# Patient Record
Sex: Male | Born: 1960 | Race: White | Hispanic: No | Smoking: Current every day smoker
Health system: Southern US, Community
[De-identification: ages and names within clinical notes are randomized; demographics above are authoritative.]

## PROBLEM LIST (undated history)

## (undated) DIAGNOSIS — F419 Anxiety disorder, unspecified: Secondary | ICD-10-CM

## (undated) DIAGNOSIS — J449 Chronic obstructive pulmonary disease, unspecified: Secondary | ICD-10-CM

## (undated) DIAGNOSIS — K219 Gastro-esophageal reflux disease without esophagitis: Secondary | ICD-10-CM

## (undated) DIAGNOSIS — M545 Low back pain: Secondary | ICD-10-CM

## (undated) DIAGNOSIS — I10 Essential (primary) hypertension: Secondary | ICD-10-CM

## (undated) DIAGNOSIS — E669 Obesity, unspecified: Secondary | ICD-10-CM

## (undated) DIAGNOSIS — G8929 Other chronic pain: Secondary | ICD-10-CM

## (undated) DIAGNOSIS — G4730 Sleep apnea, unspecified: Secondary | ICD-10-CM

## (undated) DIAGNOSIS — F259 Schizoaffective disorder, unspecified: Secondary | ICD-10-CM

## (undated) HISTORY — PX: HERNIA REPAIR: SHX51

## (undated) HISTORY — DX: Low back pain: M54.5

## (undated) HISTORY — DX: Chronic obstructive pulmonary disease, unspecified: J44.9

## (undated) HISTORY — DX: Gastro-esophageal reflux disease without esophagitis: K21.9

## (undated) HISTORY — DX: Anxiety disorder, unspecified: F41.9

## (undated) HISTORY — DX: Other chronic pain: G89.29

## (undated) HISTORY — DX: Obesity, unspecified: E66.9

## (undated) HISTORY — DX: Sleep apnea, unspecified: G47.30

## (undated) HISTORY — DX: Schizoaffective disorder, unspecified: F25.9

## (undated) HISTORY — DX: Essential (primary) hypertension: I10

---

## 2009-06-12 ENCOUNTER — Inpatient Hospital Stay: Payer: Self-pay | Admitting: Internal Medicine

## 2009-10-22 ENCOUNTER — Inpatient Hospital Stay: Payer: Self-pay | Admitting: Internal Medicine

## 2010-05-21 ENCOUNTER — Inpatient Hospital Stay: Payer: Self-pay | Admitting: Internal Medicine

## 2010-08-02 ENCOUNTER — Inpatient Hospital Stay: Payer: Self-pay | Admitting: Internal Medicine

## 2010-11-25 IMAGING — CR DG CHEST 1V PORT
1 series · 1 of 1 positions shown · non-contrast
Comparison: none

REASON FOR EXAM: resp failure
COMMENTS:

[view not recorded]
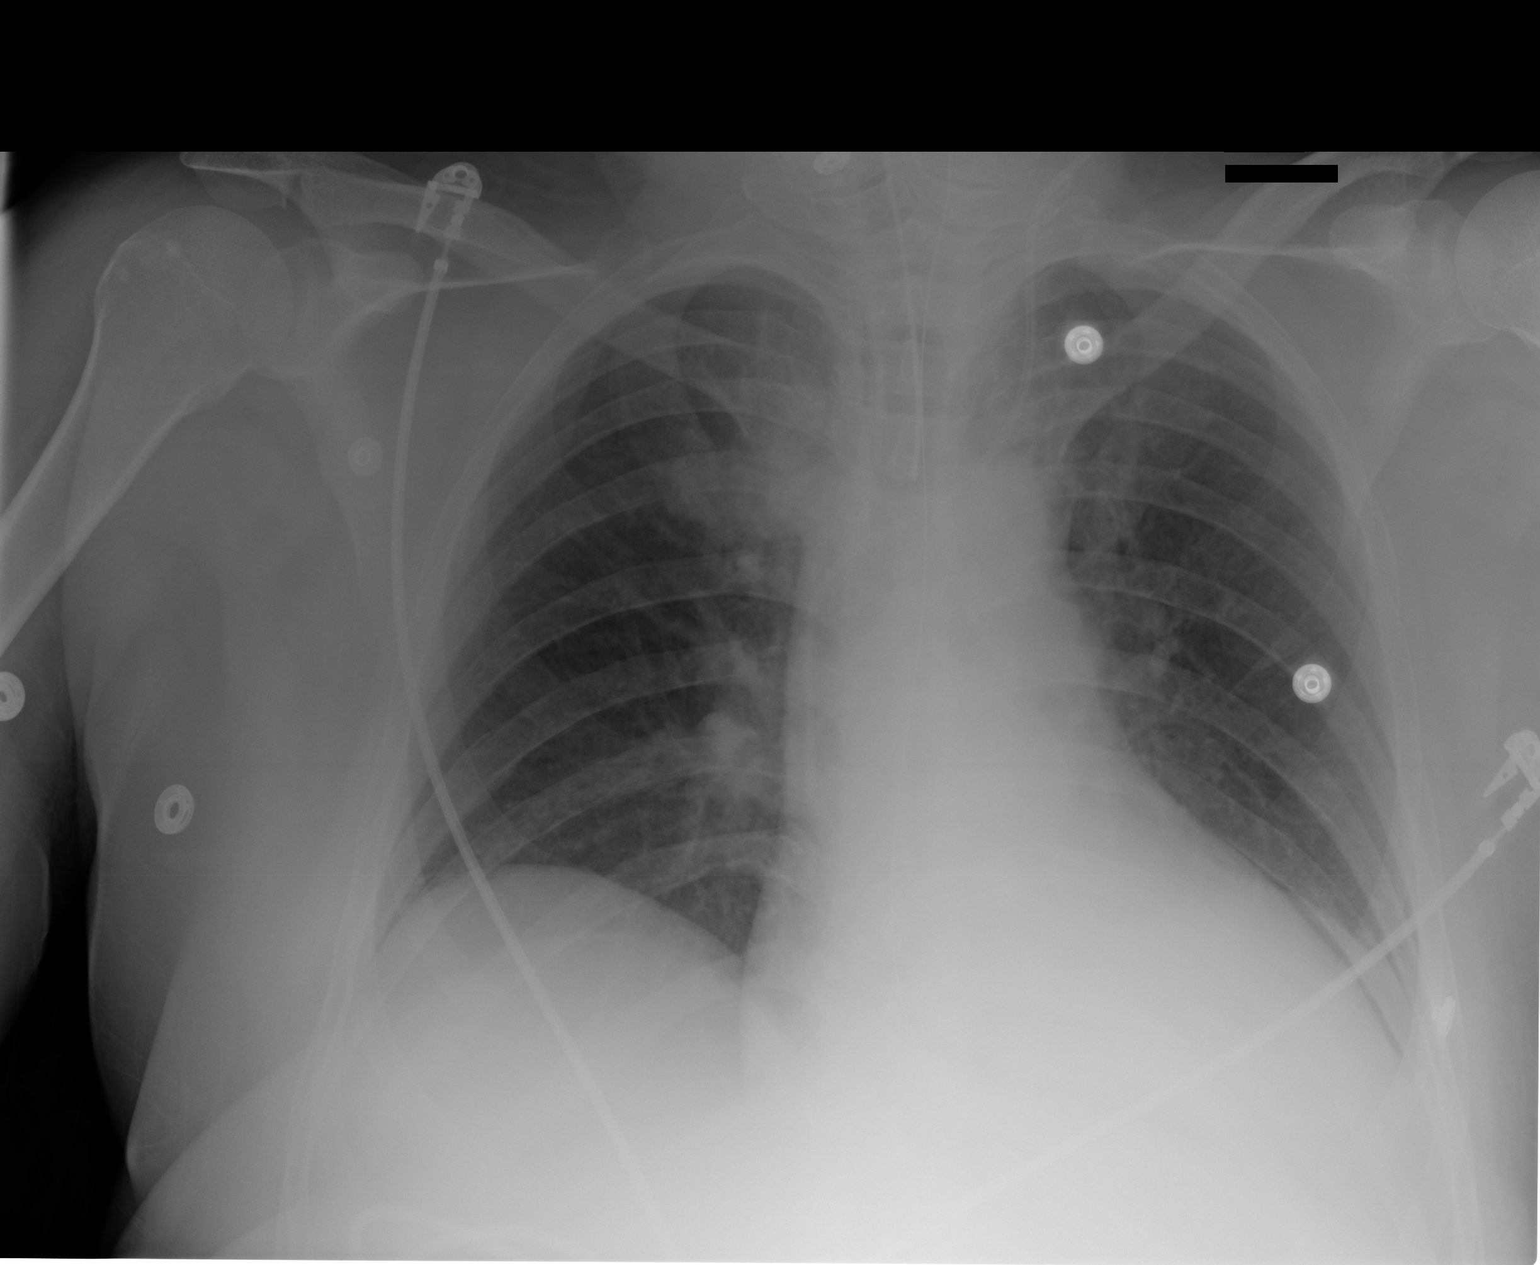

[1 of 1 positions shown; findings below may reference images not displayed]

PROCEDURE:     DXR - DXR PORTABLE CHEST SINGLE VIEW  - May 23, 2010  [DATE]

RESULT:     Comparison is made to the study 22 May, 2010.

The retrocardiac region on the left remains dense. The lungs elsewhere are
reasonably well aerated. There is no discrete focal infiltrate. The
endotracheal tube tip lies at the level of the inferior margin of the
clavicular head. The left internal jugular venous catheter tip lies in the
region of the distal SVC.
IMPRESSION: I do not see dramatic interval change in the appearance of
the chest since yesterday's study. The retrocardiac region on the left
remains dense and there is subtle density in the right paratracheal region
which is relatively stable.

## 2011-01-16 ENCOUNTER — Ambulatory Visit: Payer: Self-pay | Admitting: Internal Medicine

## 2011-01-20 ENCOUNTER — Inpatient Hospital Stay: Payer: Self-pay | Admitting: Internal Medicine

## 2011-01-24 DIAGNOSIS — I517 Cardiomegaly: Secondary | ICD-10-CM

## 2011-02-16 ENCOUNTER — Ambulatory Visit: Payer: Self-pay | Admitting: Internal Medicine

## 2011-12-17 ENCOUNTER — Ambulatory Visit: Payer: Self-pay | Admitting: Internal Medicine

## 2011-12-17 HISTORY — PX: OTHER SURGICAL HISTORY: SHX169

## 2012-01-02 ENCOUNTER — Inpatient Hospital Stay: Payer: Self-pay | Admitting: Internal Medicine

## 2012-01-02 LAB — SALICYLATE LEVEL: Salicylates, Serum: 3.4 mg/dL — ABNORMAL HIGH

## 2012-01-02 LAB — TROPONIN I: Troponin-I: 0.03 ng/mL

## 2012-01-02 LAB — DRUG SCREEN, URINE
Barbiturates, Ur Screen: NEGATIVE (ref ?–200)
Benzodiazepine, Ur Scrn: POSITIVE (ref ?–200)
Cannabinoid 50 Ng, Ur ~~LOC~~: NEGATIVE (ref ?–50)
Phencyclidine (PCP) Ur S: NEGATIVE (ref ?–25)
Tricyclic, Ur Screen: NEGATIVE (ref ?–1000)

## 2012-01-02 LAB — BASIC METABOLIC PANEL
Anion Gap: 10 (ref 7–16)
BUN: 14 mg/dL (ref 7–18)
Co2: 38 mmol/L — ABNORMAL HIGH (ref 21–32)
Creatinine: 1.34 mg/dL — ABNORMAL HIGH (ref 0.60–1.30)
EGFR (Non-African Amer.): >60
Glucose: 143 mg/dL — ABNORMAL HIGH (ref 65–99)
Potassium: 5.2 mmol/L — ABNORMAL HIGH (ref 3.5–5.1)

## 2012-01-02 LAB — PROTIME-INR
INR: 0.9
Prothrombin Time: 12.4 secs (ref 11.5–14.7)

## 2012-01-02 LAB — URINALYSIS, COMPLETE
Bilirubin,UR: NEGATIVE
Blood: NEGATIVE
Hyaline Cast: 6
Ph: 5 (ref 4.5–8.0)
RBC,UR: 2 /HPF (ref 0–5)
Specific Gravity: 1.017 (ref 1.003–1.030)
Squamous Epithelial: NONE SEEN
WBC UR: 7 /HPF (ref 0–5)

## 2012-01-02 LAB — ETHANOL: Ethanol: <3 mg/dL

## 2012-01-02 LAB — CBC
HGB: 17.6 g/dL (ref 13.0–18.0)
MCHC: 31.9 g/dL — ABNORMAL LOW (ref 32.0–36.0)
MCV: 100 fL (ref 80–100)
Platelet: 214 10*3/uL (ref 150–440)
RBC: 5.55 10*6/uL (ref 4.40–5.90)
RDW: 15.3 % — ABNORMAL HIGH (ref 11.5–14.5)
WBC: 15.5 10*3/uL — ABNORMAL HIGH (ref 3.8–10.6)

## 2012-01-02 LAB — COMPREHENSIVE METABOLIC PANEL
Alkaline Phosphatase: 123 U/L (ref 50–136)
Co2: 44 mmol/L (ref 21–32)
Potassium: 7.8 mmol/L (ref 3.5–5.1)
SGOT(AST): 33 U/L (ref 15–37)

## 2012-01-03 DIAGNOSIS — R0602 Shortness of breath: Secondary | ICD-10-CM

## 2012-01-03 DIAGNOSIS — E878 Other disorders of electrolyte and fluid balance, not elsewhere classified: Secondary | ICD-10-CM

## 2012-01-03 LAB — COMPREHENSIVE METABOLIC PANEL
Alkaline Phosphatase: 98 U/L (ref 50–136)
BUN: 16 mg/dL (ref 7–18)
Calcium, Total: 8.9 mg/dL (ref 8.5–10.1)
Chloride: 96 mmol/L — ABNORMAL LOW (ref 98–107)
Co2: 39 mmol/L — ABNORMAL HIGH (ref 21–32)
Creatinine: 1.08 mg/dL (ref 0.60–1.30)
EGFR (African American): >60
Potassium: 5.5 mmol/L — ABNORMAL HIGH (ref 3.5–5.1)
SGPT (ALT): 20 U/L
Total Protein: 6.6 g/dL (ref 6.4–8.2)

## 2012-01-03 LAB — CBC WITH DIFFERENTIAL/PLATELET
Basophil #: 0.4 10*3/uL — ABNORMAL HIGH (ref 0.0–0.1)
Basophil %: 3.2 %
Eosinophil #: 0 10*3/uL (ref 0.0–0.7)
HCT: 52.9 % — ABNORMAL HIGH (ref 40.0–52.0)
HGB: 16.8 g/dL (ref 13.0–18.0)
Lymphocyte #: 0.4 10*3/uL — ABNORMAL LOW (ref 1.0–3.6)
Lymphocyte %: 3.4 %
MCHC: 31.8 g/dL — ABNORMAL LOW (ref 32.0–36.0)
MCV: 99 fL (ref 80–100)
Monocyte #: 1 x10 3/mm (ref 0.2–1.0)
Neutrophil #: 10.9 10*3/uL — ABNORMAL HIGH (ref 1.4–6.5)
Platelet: 145 10*3/uL — ABNORMAL LOW (ref 150–440)
RDW: 14.5 % (ref 11.5–14.5)
WBC: 12.7 10*3/uL — ABNORMAL HIGH (ref 3.8–10.6)

## 2012-01-03 LAB — PROTIME-INR: INR: 0.9

## 2012-01-04 LAB — CBC WITH DIFFERENTIAL/PLATELET
Basophil #: 0.1 10*3/uL (ref 0.0–0.1)
Basophil %: 0.6 %
HGB: 15.7 g/dL (ref 13.0–18.0)
Lymphocyte %: 2.8 %
MCHC: 31.7 g/dL — ABNORMAL LOW (ref 32.0–36.0)
Monocyte #: 0.7 x10 3/mm (ref 0.2–1.0)
Monocyte %: 5.6 %
Neutrophil #: 12.1 10*3/uL — ABNORMAL HIGH (ref 1.4–6.5)
Platelet: 141 10*3/uL — ABNORMAL LOW (ref 150–440)
RDW: 14.7 % — ABNORMAL HIGH (ref 11.5–14.5)
WBC: 13.3 10*3/uL — ABNORMAL HIGH (ref 3.8–10.6)

## 2012-01-04 LAB — BASIC METABOLIC PANEL
Anion Gap: 7 (ref 7–16)
BUN: 23 mg/dL — ABNORMAL HIGH (ref 7–18)
Calcium, Total: 8.1 mg/dL — ABNORMAL LOW (ref 8.5–10.1)
Chloride: 102 mmol/L (ref 98–107)
Co2: 33 mmol/L — ABNORMAL HIGH (ref 21–32)
Creatinine: 0.94 mg/dL (ref 0.60–1.30)
EGFR (African American): >60
EGFR (Non-African Amer.): >60
Sodium: 142 mmol/L (ref 136–145)

## 2012-01-04 LAB — PHOSPHORUS: Phosphorus: 2.2 mg/dL — ABNORMAL LOW (ref 2.5–4.9)

## 2012-01-05 LAB — CBC WITH DIFFERENTIAL/PLATELET
Basophil #: 0 10*3/uL (ref 0.0–0.1)
Basophil %: 0.2 %
Eosinophil #: 0 10*3/uL (ref 0.0–0.7)
HCT: 47.2 % (ref 40.0–52.0)
HGB: 15.2 g/dL (ref 13.0–18.0)
Lymphocyte %: 4.4 %
MCH: 30.8 pg (ref 26.0–34.0)
MCHC: 32.2 g/dL (ref 32.0–36.0)
Neutrophil %: 88.3 %
RDW: 14.7 % — ABNORMAL HIGH (ref 11.5–14.5)
WBC: 12.1 10*3/uL — ABNORMAL HIGH (ref 3.8–10.6)

## 2012-01-05 LAB — BASIC METABOLIC PANEL
Anion Gap: 5 — ABNORMAL LOW (ref 7–16)
BUN: 21 mg/dL — ABNORMAL HIGH (ref 7–18)
Creatinine: 0.72 mg/dL (ref 0.60–1.30)
EGFR (African American): >60
EGFR (Non-African Amer.): >60
Glucose: 130 mg/dL — ABNORMAL HIGH (ref 65–99)
Potassium: 3.2 mmol/L — ABNORMAL LOW (ref 3.5–5.1)
Sodium: 143 mmol/L (ref 136–145)

## 2012-01-06 LAB — CBC WITH DIFFERENTIAL/PLATELET
Basophil #: 0 10*3/uL (ref 0.0–0.1)
Eosinophil %: 0 %
HGB: 15.6 g/dL (ref 13.0–18.0)
Lymphocyte #: 0.5 10*3/uL — ABNORMAL LOW (ref 1.0–3.6)
Lymphocyte %: 6.7 %
MCH: 31.1 pg (ref 26.0–34.0)
MCV: 95 fL (ref 80–100)
Monocyte #: 0.6 x10 3/mm (ref 0.2–1.0)
Neutrophil #: 6.7 10*3/uL — ABNORMAL HIGH (ref 1.4–6.5)
Neutrophil %: 85.7 %
Platelet: 119 10*3/uL — ABNORMAL LOW (ref 150–440)
RBC: 5.01 10*6/uL (ref 4.40–5.90)
WBC: 7.8 10*3/uL (ref 3.8–10.6)

## 2012-01-06 LAB — BASIC METABOLIC PANEL
BUN: 26 mg/dL — ABNORMAL HIGH (ref 7–18)
Co2: 37 mmol/L — ABNORMAL HIGH (ref 21–32)
Creatinine: 0.61 mg/dL (ref 0.60–1.30)
EGFR (African American): >60
EGFR (Non-African Amer.): >60
Osmolality: 294 (ref 275–301)
Potassium: 3.2 mmol/L — ABNORMAL LOW (ref 3.5–5.1)

## 2012-01-06 LAB — POTASSIUM: Potassium: 3 mmol/L — ABNORMAL LOW (ref 3.5–5.1)

## 2012-01-07 LAB — CBC WITH DIFFERENTIAL/PLATELET
Basophil #: 0 10*3/uL (ref 0.0–0.1)
Eosinophil #: 0 10*3/uL (ref 0.0–0.7)
Eosinophil %: 0 %
HCT: 49.3 % (ref 40.0–52.0)
HGB: 16.2 g/dL (ref 13.0–18.0)
Lymphocyte #: 0.6 10*3/uL — ABNORMAL LOW (ref 1.0–3.6)
Lymphocyte %: 5.5 %
MCHC: 32.8 g/dL (ref 32.0–36.0)
MCV: 95 fL (ref 80–100)
Monocyte %: 12.8 %
RDW: 14.8 % — ABNORMAL HIGH (ref 11.5–14.5)
WBC: 10.3 10*3/uL (ref 3.8–10.6)

## 2012-01-07 LAB — HEPATIC FUNCTION PANEL A (ARMC)
Albumin: 2.5 g/dL — ABNORMAL LOW (ref 3.4–5.0)
Bilirubin, Direct: 0.2 mg/dL (ref 0.00–0.20)
SGPT (ALT): 58 U/L
Total Protein: 5.9 g/dL — ABNORMAL LOW (ref 6.4–8.2)

## 2012-01-07 LAB — LIPASE, BLOOD: Lipase: 250 U/L (ref 73–393)

## 2012-01-07 LAB — BASIC METABOLIC PANEL
Calcium, Total: 8.3 mg/dL — ABNORMAL LOW (ref 8.5–10.1)
Chloride: 104 mmol/L (ref 98–107)
Co2: 36 mmol/L — ABNORMAL HIGH (ref 21–32)
EGFR (Non-African Amer.): >60
Glucose: 137 mg/dL — ABNORMAL HIGH (ref 65–99)
Osmolality: 299 (ref 275–301)
Potassium: 2.8 mmol/L — ABNORMAL LOW (ref 3.5–5.1)
Sodium: 147 mmol/L — ABNORMAL HIGH (ref 136–145)

## 2012-01-07 LAB — POTASSIUM
Potassium: 3.3 mmol/L — ABNORMAL LOW (ref 3.5–5.1)
Potassium: 3.3 mmol/L — ABNORMAL LOW (ref 3.5–5.1)

## 2012-01-08 LAB — URINALYSIS, COMPLETE
Bilirubin,UR: NEGATIVE
Glucose,UR: NEGATIVE mg/dL (ref 0–75)
Leukocyte Esterase: NEGATIVE
Nitrite: NEGATIVE
Protein: 30
RBC,UR: 298 /HPF (ref 0–5)
Squamous Epithelial: NONE SEEN
WBC UR: 4 /HPF (ref 0–5)

## 2012-01-08 LAB — BASIC METABOLIC PANEL
Anion Gap: 3 — ABNORMAL LOW (ref 7–16)
BUN: 26 mg/dL — ABNORMAL HIGH (ref 7–18)
Calcium, Total: 8.1 mg/dL — ABNORMAL LOW (ref 8.5–10.1)
Chloride: 106 mmol/L (ref 98–107)
Co2: 36 mmol/L — ABNORMAL HIGH (ref 21–32)
Creatinine: 0.8 mg/dL (ref 0.60–1.30)
EGFR (Non-African Amer.): >60
Glucose: 116 mg/dL — ABNORMAL HIGH (ref 65–99)
Osmolality: 294 (ref 275–301)
Sodium: 145 mmol/L (ref 136–145)

## 2012-01-09 LAB — CBC WITH DIFFERENTIAL/PLATELET
Basophil #: 0 10*3/uL (ref 0.0–0.1)
Eosinophil %: 0.1 %
HCT: 46.3 % (ref 40.0–52.0)
HGB: 14.9 g/dL (ref 13.0–18.0)
Lymphocyte #: 0.9 10*3/uL — ABNORMAL LOW (ref 1.0–3.6)
Lymphocyte %: 6.2 %
MCH: 30.4 pg (ref 26.0–34.0)
MCHC: 32.2 g/dL (ref 32.0–36.0)
MCV: 94 fL (ref 80–100)
Monocyte #: 0.9 x10 3/mm (ref 0.2–1.0)
Neutrophil #: 12.4 10*3/uL — ABNORMAL HIGH (ref 1.4–6.5)
Platelet: 139 10*3/uL — ABNORMAL LOW (ref 150–440)
RBC: 4.91 10*6/uL (ref 4.40–5.90)
RDW: 14.6 % — ABNORMAL HIGH (ref 11.5–14.5)
WBC: 14.2 10*3/uL — ABNORMAL HIGH (ref 3.8–10.6)

## 2012-01-09 LAB — BASIC METABOLIC PANEL
Anion Gap: 9 (ref 7–16)
BUN: 18 mg/dL (ref 7–18)
Chloride: 105 mmol/L (ref 98–107)
Co2: 26 mmol/L (ref 21–32)
EGFR (Non-African Amer.): >60
Potassium: 3.7 mmol/L (ref 3.5–5.1)

## 2012-01-10 ENCOUNTER — Telehealth: Payer: Self-pay | Admitting: Internal Medicine

## 2012-01-10 NOTE — Telephone Encounter (Signed)
Teressa at [facility] called to let you know that patient may be discharged from [facility] on 01/11/12.  I told Runell Gess that you're out of town and wouldn't be back until Thursday,but I would send you the message.

## 2012-01-11 NOTE — Telephone Encounter (Signed)
Okay  I might try to see him next week then

## 2012-01-13 ENCOUNTER — Encounter: Payer: Self-pay | Admitting: Internal Medicine

## 2012-01-13 DIAGNOSIS — G4730 Sleep apnea, unspecified: Secondary | ICD-10-CM | POA: Insufficient documentation

## 2012-01-13 DIAGNOSIS — J449 Chronic obstructive pulmonary disease, unspecified: Secondary | ICD-10-CM | POA: Insufficient documentation

## 2012-01-13 DIAGNOSIS — F259 Schizoaffective disorder, unspecified: Secondary | ICD-10-CM | POA: Insufficient documentation

## 2012-01-13 DIAGNOSIS — I10 Essential (primary) hypertension: Secondary | ICD-10-CM | POA: Insufficient documentation

## 2012-01-13 DIAGNOSIS — E669 Obesity, unspecified: Secondary | ICD-10-CM | POA: Insufficient documentation

## 2012-01-16 ENCOUNTER — Ambulatory Visit: Payer: Self-pay | Admitting: Internal Medicine

## 2012-01-16 ENCOUNTER — Telehealth: Payer: Self-pay | Admitting: *Deleted

## 2012-01-16 NOTE — Telephone Encounter (Signed)
Hospice nurse received call from pt's caregiver asking if he can increase his Norco from every 6 hours to every 4 hours for back pain, nurse states you will see the patient on Wednesday? You haven't had a visit yet so she wasn't sure what exactly you could do? Please advise.

## 2012-01-16 NOTE — Telephone Encounter (Signed)
Okay to change to every 4 hours prn Will review at visit on Wednesday

## 2012-01-16 NOTE — Telephone Encounter (Signed)
Left message with hospice nurse and advised results  

## 2012-01-18 ENCOUNTER — Encounter: Payer: Self-pay | Admitting: Internal Medicine

## 2012-01-18 ENCOUNTER — Ambulatory Visit: Payer: PRIVATE HEALTH INSURANCE | Admitting: Internal Medicine

## 2012-01-18 VITALS — BP 130/80 | HR 104 | Resp 36

## 2012-01-18 DIAGNOSIS — F419 Anxiety disorder, unspecified: Secondary | ICD-10-CM

## 2012-01-18 DIAGNOSIS — G8929 Other chronic pain: Secondary | ICD-10-CM

## 2012-01-18 DIAGNOSIS — F411 Generalized anxiety disorder: Secondary | ICD-10-CM

## 2012-01-18 DIAGNOSIS — I10 Essential (primary) hypertension: Secondary | ICD-10-CM

## 2012-01-18 DIAGNOSIS — K219 Gastro-esophageal reflux disease without esophagitis: Secondary | ICD-10-CM

## 2012-01-18 DIAGNOSIS — J449 Chronic obstructive pulmonary disease, unspecified: Secondary | ICD-10-CM

## 2012-01-18 DIAGNOSIS — M545 Low back pain, unspecified: Secondary | ICD-10-CM

## 2012-01-18 DIAGNOSIS — F259 Schizoaffective disorder, unspecified: Secondary | ICD-10-CM

## 2012-01-18 NOTE — Assessment & Plan Note (Signed)
Severe with marked functional dependence On hospice care Will restart duoneb which helps him advair Albuterol prn also Just finished prednisone taper

## 2012-01-18 NOTE — Assessment & Plan Note (Signed)
Had some ongoing problems that weren't treated Will decrease pantoprazole to once daily Consider change to ranitidine if doing well

## 2012-01-18 NOTE — Assessment & Plan Note (Signed)
Had been on huge doses of narcotics and went into respiratory failure Now just on norco Will increase to a maximum of 8 of these daily PT eval as the pain seems to be lumbar paraspinals mostly and muscular---may be related to constant bed rest

## 2012-01-18 NOTE — Assessment & Plan Note (Signed)
Ongoing issues Xanax dose decreased in hospital Will continue current dose for now and increase if seriously worsens

## 2012-01-18 NOTE — Progress Notes (Signed)
Subjective:    Patient ID: Corey Kim., male    DOB: July 26, 1960, 51 y.o.   MRN: [id number]  HPI Establishing care Being treated by doctor at [facility] urgent care (wasn't regular)  Had major respiratory event about 1 year ago Wound up at hospice home Did improve and came home Lives with daughter, her fiancee, and another friend  Hospitalized last week for respiratory failure due to narcotic overdose Had been on large doses of fentanyl which were stopped He is unaware of specific pain diagnosis He has ongoing low back pain for a couple of years--was told this was related to his lungs  Some cough but not bad Had productive cough before hospitalization---better now Bedbound but does try to walk in house (trailer) Bed bath by hospice staff Now needs cammode because he can't make it down hallway to bathroom due to dyspnea Continuous oxygen Has walker for use in house Doesn't leave house  Sleep apnea diagnosis but doesn't use CPAP If sleeps okay (back not bothering him), he will have restorative sleep  Diagnosed with HTN about 7-8 years Feels the medications are okay for him  Heartburn while in hospital Some symptoms in past  Controlled on the protonix  Schizoaffective disorder diagnosed at age 51 Caused disability from work Has been hospitalized in [facility] at first diagnosis Sees psychiatrist locally--but not lately Paranoia and delusional in past (thought he could make it rain, etc), hallucinations---all controlled on the med Chronic anxiety in addition to this  Current Outpatient Prescriptions on File Prior to Visit  Medication Sig Dispense Refill  . albuterol (PROVENTIL HFA;VENTOLIN HFA) 108 (90 BASE) MCG/ACT inhaler Inhale 2 puffs into the lungs every 6 (six) hours as needed.      . ALPRAZolam (XANAX) 0.25 MG tablet Take 0.25 mg by mouth every 4 (four) hours as needed.      Marland Kitchen amLODipine (NORVASC) 10 MG tablet Take 10 mg by mouth daily.      Marland Kitchen  Dextromethorphan-Guaifenesin (GUAIFENESIN-DM) 100-10 MG/5ML LIQD Take 5-10 mLs by mouth every 4 (four) hours as needed.      . docusate sodium (COLACE) 100 MG capsule Take 100 mg by mouth daily.      . fluPHENAZine decanoate (PROLIXIN) 25 MG/ML injection Inject into the muscle every 30 (thirty) days.      . Fluticasone-Salmeterol (ADVAIR) 250-50 MCG/DOSE AEPB Inhale 1 puff into the lungs every 12 (twelve) hours.      . hydrALAZINE (APRESOLINE) 25 MG tablet Take 25 mg by mouth 3 (three) times daily.      Marland Kitchen HYDROcodone-acetaminophen (NORCO) 5-325 MG per tablet Take 1 tablet by mouth every 4 (four) hours as needed. May give additional 2 tabs daily prn MDD --8      . ipratropium-albuterol (DUONEB) 0.5-2.5 (3) MG/3ML SOLN Take 3 mLs by nebulization 4 (four) times daily.      . magnesium hydroxide (MILK OF MAGNESIA) 400 MG/5ML suspension Take 15-30 mLs by mouth daily as needed.      . pantoprazole (PROTONIX) 40 MG tablet Take 40 mg by mouth daily.       . prochlorperazine (COMPAZINE) 10 MG tablet Take 10 mg by mouth every 4 (four) hours as needed.        Allergies  Allergen Reactions  . Shellfish Allergy     Past Medical History  Diagnosis Date  . COPD (chronic obstructive pulmonary disease)   . Hypertension   . Schizoaffective disorder   . Sleep apnea   . Obesity   .  Anxiety     chronic  . GERD (gastroesophageal reflux disease)   . Chronic low back pain     Past Surgical History  Procedure Date  . Respiratory failure due to narcotic overdose 6/13  . Hernia repair ~2007    bilateral inguinal    Family History  Problem Relation Age of Onset  . Diabetes Brother   . Hearing loss Neg Hx     History   Social History  . Marital Status: Legally Separated    Spouse Name: N/A    Number of Children: 1  . Years of Education: N/A   Occupational History  . 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 Smoking status: Current  Everyday Smoker  . Smokeless tobacco: Never Used  . Alcohol Use: No     some in past--not now  . Drug Use: Not on file  . Sexually Active: Not on file   Other Topics Concern  . Not on file   Social History Narrative   No living willDaughter should make health care decisions if he is unable to Had DNR in recent hospitalization but now feels that he would accept resuscitation. Would not want trach.Would accept feeding tube   Review of Systems  Constitutional: Positive for fatigue and unexpected weight change.       Lost 16# in recent hospitalization  HENT: Positive for congestion, rhinorrhea and dental problem.        Seasonal allergies---uses OTC meds Bad teeth--can't get out to dentist  Eyes: Negative for visual disturbance.       No diplopia or unilateral vision loss  Respiratory: Positive for cough and shortness of breath. Negative for chest tightness.   Cardiovascular: Negative for chest pain, palpitations and leg swelling.  Gastrointestinal: Positive for nausea. Negative for vomiting, abdominal pain, constipation and blood in stool.       Some nausea at times Heartburn better with PPI  Genitourinary: Negative for dysuria, urgency and difficulty urinating.  Musculoskeletal: Positive for back pain and arthralgias. Negative for joint swelling.  Skin: Negative for rash.  Neurological: Negative for dizziness, syncope, weakness and light-headedness.  Hematological: Negative for adenopathy. Bruises/bleeds easily.  Psychiatric/Behavioral: Negative for suicidal ideas, disturbed wake/sleep cycle and dysphoric mood. The patient is nervous/anxious.        Psychotic symptoms controlled Sleeps okay unless back hurting       Objective:   Physical Exam  Constitutional: He is oriented to person, place, and time. He appears well-developed. No distress.  HENT:  Mouth/Throat: Oropharynx is clear and moist. No oropharyngeal exudate.  Eyes: Conjunctivae and EOM are normal. Pupils are equal,  round, and reactive to light.  Neck: Normal range of motion. Neck supple. No thyromegaly present.  Cardiovascular: Normal rate, regular rhythm, normal heart sounds and intact distal pulses.  Exam reveals no gallop.   No murmur heard. Pulmonary/Chest: Effort normal. No respiratory distress. He has no wheezes. He has no rales.       Almost no air movement  Abdominal: Soft. There is no tenderness.  Musculoskeletal: He exhibits no edema and no tenderness.  Lymphadenopathy:    He has no cervical adenopathy.  Neurological: He is alert and oriented to person, place, and time. He exhibits normal muscle tone.       No focal weakness  Skin: No rash noted. No erythema.  Psychiatric: He has a normal mood and affect. His behavior is normal. Thought content  normal.          Assessment & Plan:

## 2012-01-18 NOTE — Assessment & Plan Note (Signed)
BP Readings from Last 3 Encounters:  01/18/12 130/80   Good control No change

## 2012-01-18 NOTE — Assessment & Plan Note (Signed)
Has been controlled on the prolixin No psychosis lately Will continue

## 2012-01-19 ENCOUNTER — Telehealth: Payer: Self-pay | Admitting: Endocrinology

## 2012-01-19 NOTE — Telephone Encounter (Signed)
Hospice nurse calls.  Pt is in pain at lower back.  Wants to increase narcotics Dr Alphonsus Sias just increased yesterday, and was hospitalized last week with OD.   Please continue same Call dr Alphonsus Sias if sxs persist.

## 2012-01-21 ENCOUNTER — Inpatient Hospital Stay: Payer: Self-pay | Admitting: Internal Medicine

## 2012-01-21 LAB — CBC
HCT: 32.8 % — ABNORMAL LOW (ref 40.0–52.0)
RDW: 15 % — ABNORMAL HIGH (ref 11.5–14.5)
WBC: 19.6 10*3/uL — ABNORMAL HIGH (ref 3.8–10.6)

## 2012-01-21 LAB — URINALYSIS, COMPLETE
Bacteria: NONE SEEN
Blood: NEGATIVE
Glucose,UR: NEGATIVE mg/dL (ref 0–75)
Hyaline Cast: 3
Ketone: NEGATIVE
Protein: NEGATIVE
RBC,UR: <1 /HPF (ref 0–5)
Specific Gravity: 1.011 (ref 1.003–1.030)
WBC UR: 1 /HPF (ref 0–5)

## 2012-01-21 LAB — COMPREHENSIVE METABOLIC PANEL
Albumin: 3 g/dL — ABNORMAL LOW (ref 3.4–5.0)
Alkaline Phosphatase: 73 U/L (ref 50–136)
BUN: 10 mg/dL (ref 7–18)
Bilirubin,Total: 0.2 mg/dL (ref 0.2–1.0)
Co2: 42 mmol/L (ref 21–32)
Creatinine: 0.57 mg/dL — ABNORMAL LOW (ref 0.60–1.30)
EGFR (African American): >60
EGFR (Non-African Amer.): >60
Glucose: 159 mg/dL — ABNORMAL HIGH (ref 65–99)
SGPT (ALT): 25 U/L

## 2012-01-21 LAB — TROPONIN I: Troponin-I: <0.02 ng/mL

## 2012-01-21 LAB — CK TOTAL AND CKMB (NOT AT ARMC)
CK, Total: 49 U/L (ref 35–232)
CK, Total: 37 U/L (ref 35–232)
CK-MB: 4.5 ng/mL — ABNORMAL HIGH (ref 0.5–3.6)

## 2012-01-21 LAB — PROTIME-INR: Prothrombin Time: 11.1 secs — ABNORMAL LOW (ref 11.5–14.7)

## 2012-01-21 NOTE — Telephone Encounter (Signed)
I agree I increased from 4-up to 8 a day No increase as yet

## 2012-01-22 LAB — MAGNESIUM: Magnesium: 2 mg/dL

## 2012-01-22 LAB — COMPREHENSIVE METABOLIC PANEL
Albumin: 2.6 g/dL — ABNORMAL LOW (ref 3.4–5.0)
Anion Gap: 2 — ABNORMAL LOW (ref 7–16)
BUN: 19 mg/dL — ABNORMAL HIGH (ref 7–18)
Calcium, Total: 8.4 mg/dL — ABNORMAL LOW (ref 8.5–10.1)
Chloride: 97 mmol/L — ABNORMAL LOW (ref 98–107)
Creatinine: 0.6 mg/dL (ref 0.60–1.30)
EGFR (African American): >60
Glucose: 163 mg/dL — ABNORMAL HIGH (ref 65–99)
Osmolality: 282 (ref 275–301)
Potassium: 5 mmol/L (ref 3.5–5.1)
SGPT (ALT): 19 U/L
Total Protein: 5.4 g/dL — ABNORMAL LOW (ref 6.4–8.2)

## 2012-01-22 LAB — CBC WITH DIFFERENTIAL/PLATELET
Basophil %: 0.1 %
Eosinophil #: 0 10*3/uL (ref 0.0–0.7)
Eosinophil %: 0 %
HCT: 26 % — ABNORMAL LOW (ref 40.0–52.0)
HGB: 8.3 g/dL — ABNORMAL LOW (ref 13.0–18.0)
Lymphocyte #: 0.6 10*3/uL — ABNORMAL LOW (ref 1.0–3.6)
Lymphocyte %: 4.1 %
Neutrophil %: 92.6 %
Platelet: 237 10*3/uL (ref 150–440)
RBC: 2.7 10*6/uL — ABNORMAL LOW (ref 4.40–5.90)

## 2012-01-23 ENCOUNTER — Telehealth: Payer: Self-pay | Admitting: Internal Medicine

## 2012-01-23 LAB — CBC WITH DIFFERENTIAL/PLATELET
Basophil %: 0.3 %
Eosinophil %: 0.1 %
HCT: 25.6 % — ABNORMAL LOW (ref 40.0–52.0)
HGB: 8.3 g/dL — ABNORMAL LOW (ref 13.0–18.0)
Lymphocyte %: 5.2 %
MCH: 31.4 pg (ref 26.0–34.0)
MCHC: 32.4 g/dL (ref 32.0–36.0)
MCV: 97 fL (ref 80–100)
Monocyte #: 0.2 x10 3/mm (ref 0.2–1.0)
Neutrophil #: 9.4 10*3/uL — ABNORMAL HIGH (ref 1.4–6.5)
Neutrophil %: 92.9 %
RBC: 2.65 10*6/uL — ABNORMAL LOW (ref 4.40–5.90)
WBC: 10.1 10*3/uL (ref 3.8–10.6)

## 2012-01-23 NOTE — Telephone Encounter (Signed)
Teressa from [facility] called to inform you that Corey Kim was taken to the hospital Friday night 01/20/12 for shortness of breath.  They moved him to a regular floor yesterday.  Runell Gess said she will send you the discharge paperwork when he is released.

## 2012-01-23 NOTE — Telephone Encounter (Signed)
okay

## 2012-01-30 ENCOUNTER — Telehealth: Payer: Self-pay

## 2012-01-30 NOTE — Telephone Encounter (Signed)
Corey Kim with [facility] left v/m; Multiple issues:1) Early this AM on call hospice nurse advised caregiver to give pt Roxinol in comfort kit for severe back pain, one hour later care giver called back; pain level 5 and another dose of Roxinol given. Care giver instructed not to give further Roxinol without instruction from hospice nurse. Need refill on Roxinol 20 mg/ml with instruction 0.25 ml - 1 ml every hour as needed. If Dr Alphonsus Sias will order fax rx with hospice pt on prescription to [facility]. 2) pt finished prednisone taper from last hospitalization; pt has little more SOB and back discomfort. Can Dr Alphonsus Sias prescribe small dose of steroid daily? 3) pt has slight swelling in lower extremity; 1-2 + non pitting edema in both feet today; started 2 days ago.[facility].Please advise.

## 2012-01-30 NOTE — Telephone Encounter (Signed)
The roxanol is not for his back and since he wants hospital evaluation if his breathing worsens---no palliative morphine is needed in house PT referral has been made for his back now  Okay to start prednisone 10mg  daily for his COPD

## 2012-01-31 ENCOUNTER — Telehealth: Payer: Self-pay | Admitting: Internal Medicine

## 2012-02-16 ENCOUNTER — Ambulatory Visit: Payer: Self-pay | Admitting: Internal Medicine

## 2012-02-16 NOTE — Telephone Encounter (Signed)
Received call from Officer Judge---police Patient is deceased No concerning things at death scene  Spoke later to [facility] the nurse He had had some change in condition and appearance yesterday Had declined hospital evaluation to family during night Up in early AM then back to sleep and was dead when they next awoke  I let the officer know that I will do the death certificate

## 2012-02-16 DEATH — deceased

## 2012-04-25 ENCOUNTER — Ambulatory Visit: Payer: PRIVATE HEALTH INSURANCE | Admitting: Internal Medicine

## 2012-07-07 IMAGING — CR DG CHEST 1V PORT
1 series · 1 of 1 positions shown · non-contrast
Comparison: none

REASON FOR EXAM: central lne placement
COMMENTS:

[portable]
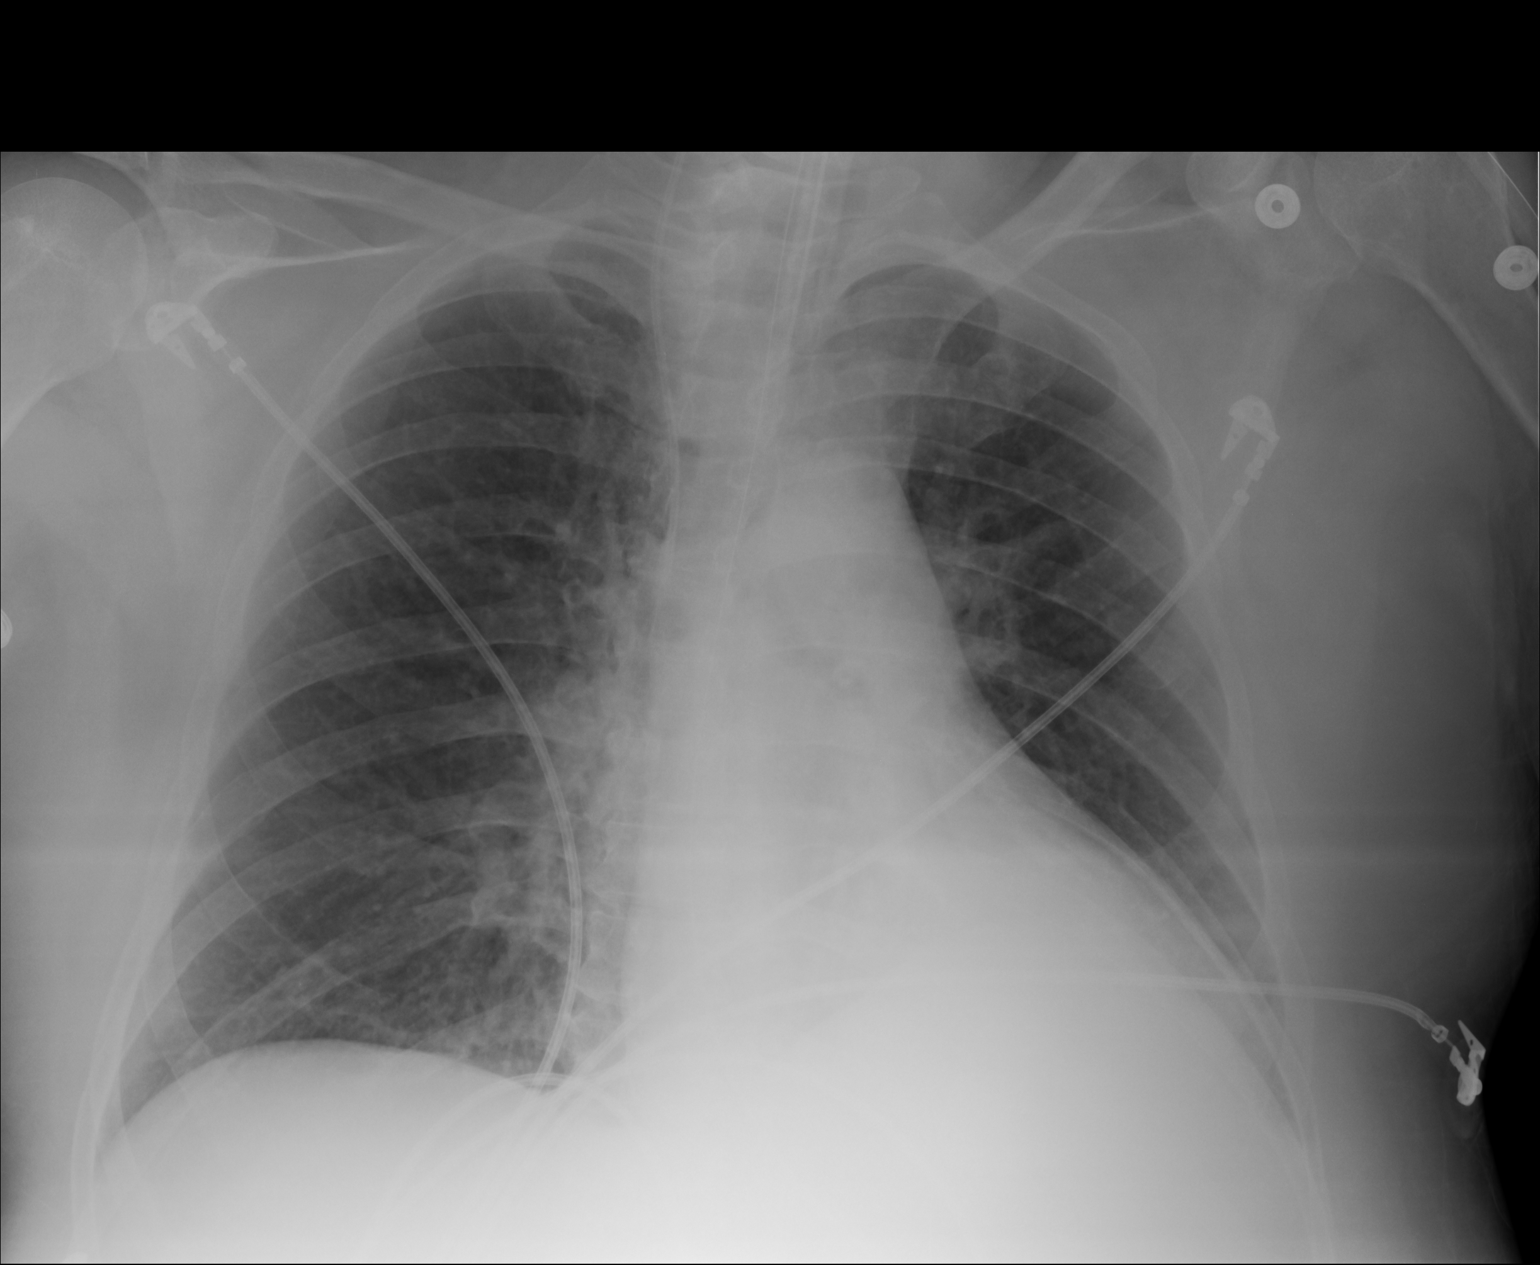

[1 of 1 positions shown; findings below may reference images not displayed]

PROCEDURE:     DXR - DXR PORTABLE CHEST SINGLE VIEW  - January 03, 2012 [DATE]

RESULT:     Comparison is made to the prior exam of 01/02/2012. There
persists increased density at the left base compatible with atelectasis or
pneumonia. There has been no appreciable interval change since the prior
exam. The right lung field remains clear. An endotracheal tube is present
with the tip approximately 5.7 cm above the carina. A right jugular venous
catheter is present with the tip projected over the midportion of the
inferior vena cava. A nasogastric tube is present, but the inferior portion
is obscured. No acute changes of the heart or pulmonary vasculature are
seen. Monitoring electrodes are present.
IMPRESSION: 1. There persists increased density at the left base compatible with
pneumonia or atelectasis.
2. Support lines are observed as noted above.

[REDACTED]

## 2012-07-11 IMAGING — CT CT ABD-PELV W/O CM
1 of 2 series · 15 of 32 positions shown, 19 images · non-contrast
Comparison: none

REASON FOR EXAM: (1) abdominal pain, nausea and vomiting; (2) abdominal
pain, nausea and vomiting
COMMENTS:

[Series 2: 3mm soft tissue · axial · 0.87mm/px · z∈[-1398,-938]mm · 15 of 167 slices shown, 19 images]
[im 7/167  soft-tissue]
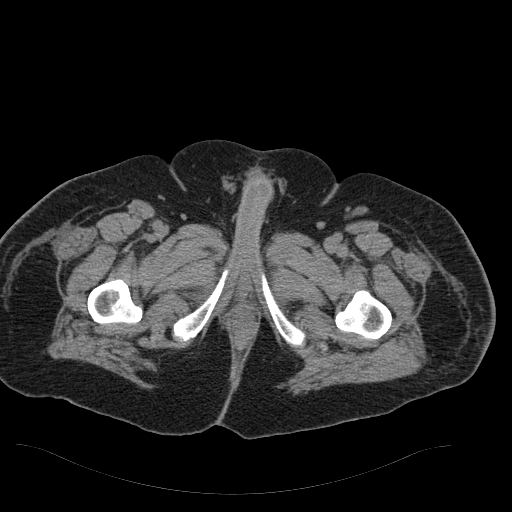
[im 7/167  bone]
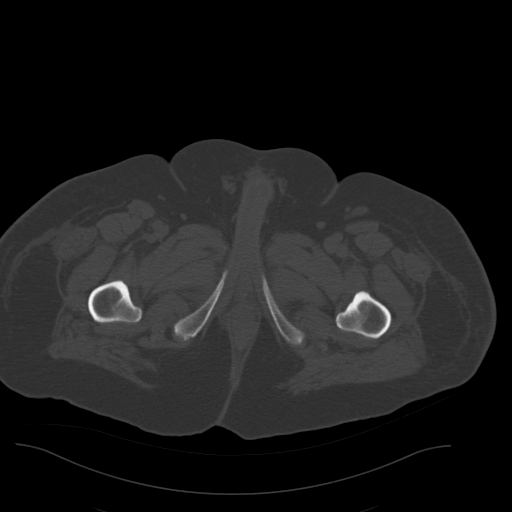
[im 21/167  soft-tissue]
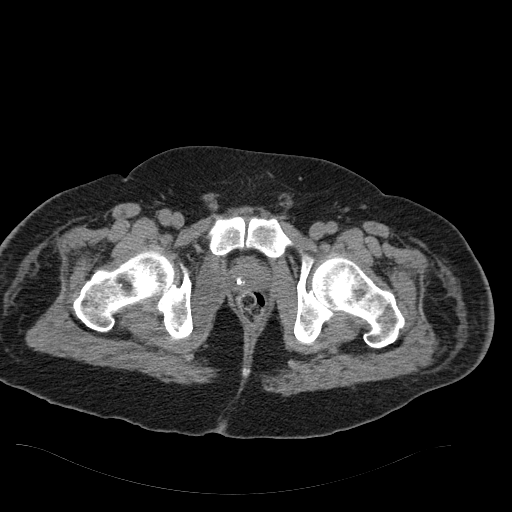
[im 35/167  soft-tissue]
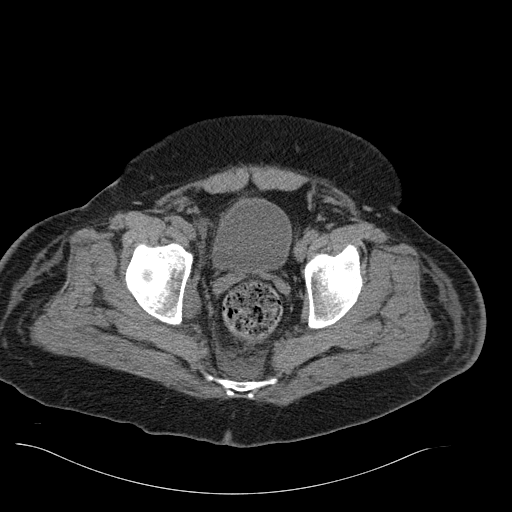
[im 49/167  soft-tissue]
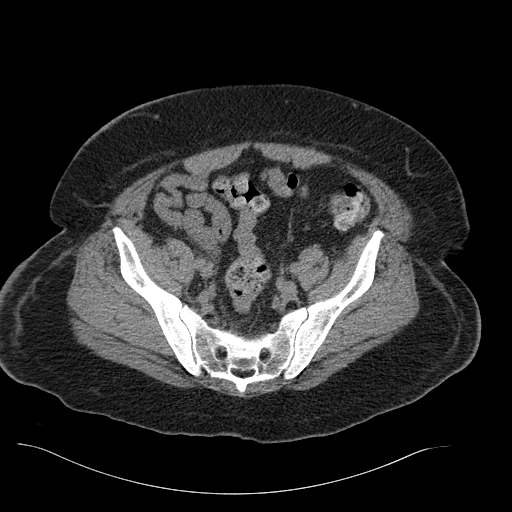
[im 56/167  soft-tissue]
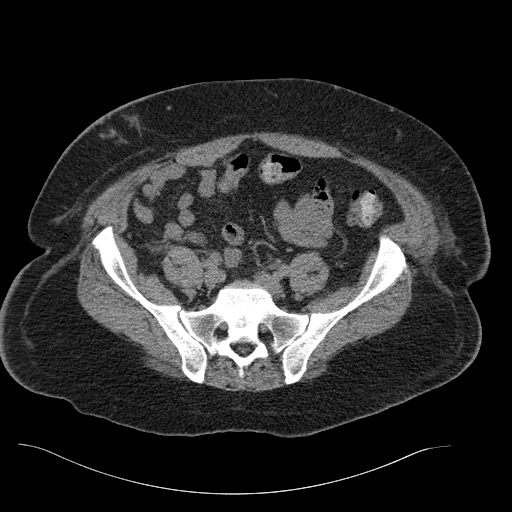
[im 70/167  soft-tissue]
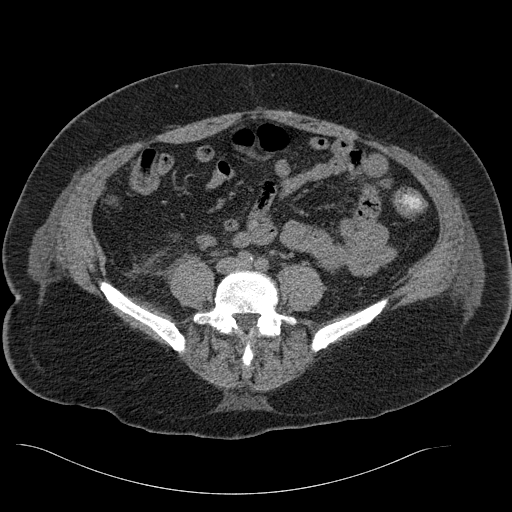
[im 84/167  soft-tissue]
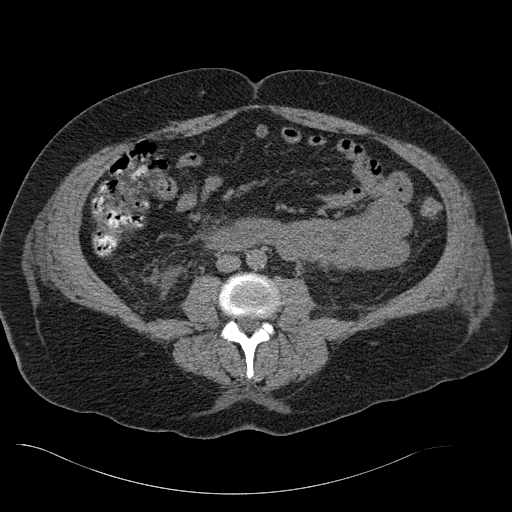
[im 97/167  soft-tissue]
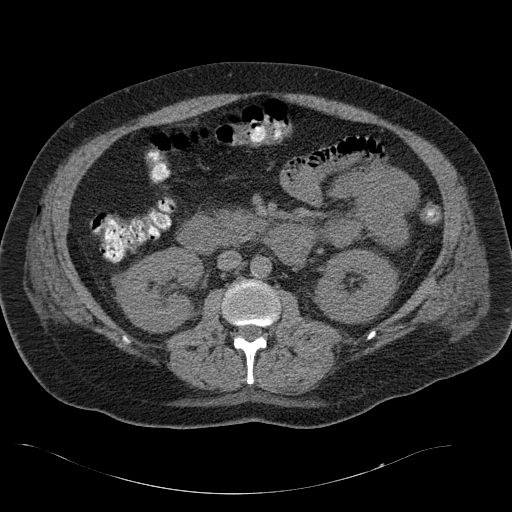
[im 111/167  soft-tissue]
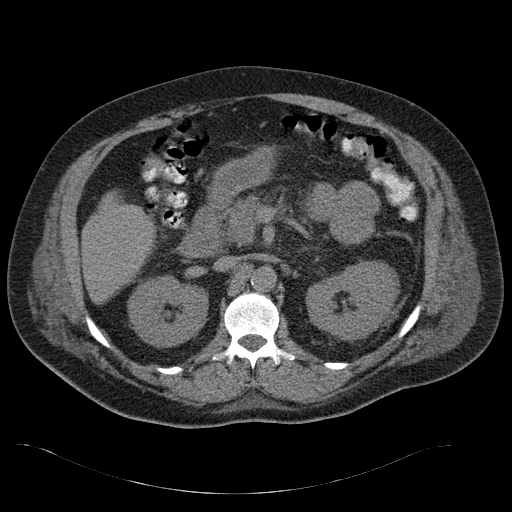
[im 111/167  bone]
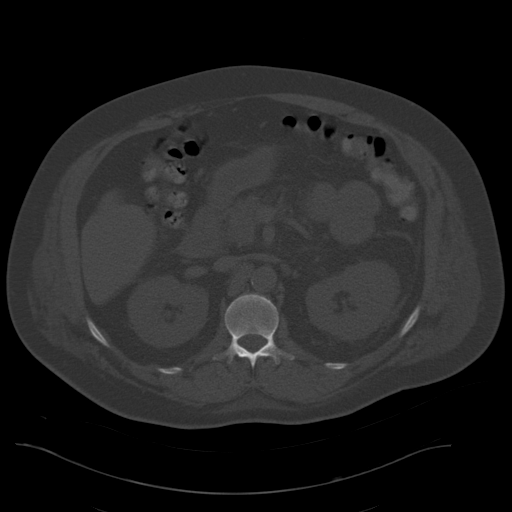
[im 118/167  soft-tissue]
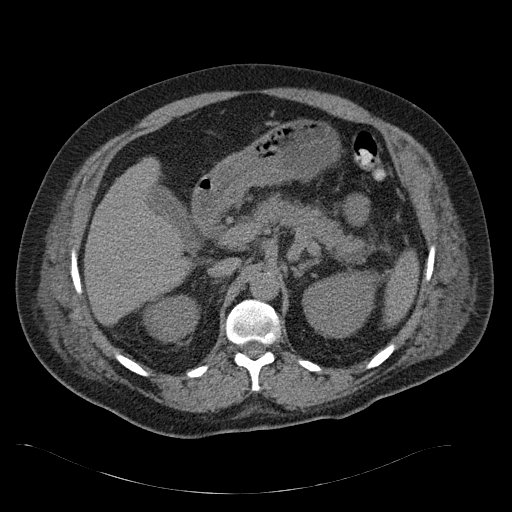
[im 132/167  soft-tissue]
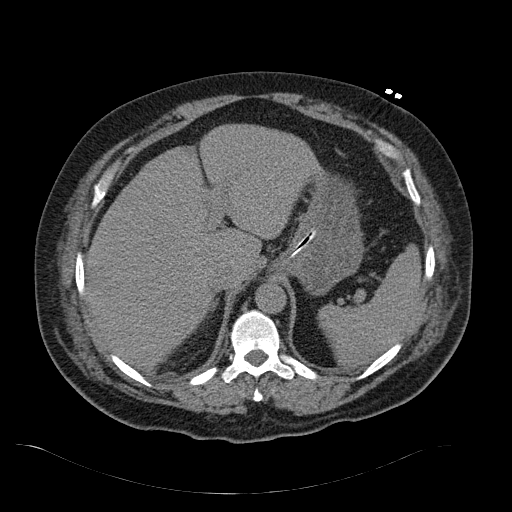
[im 139/167  lung]
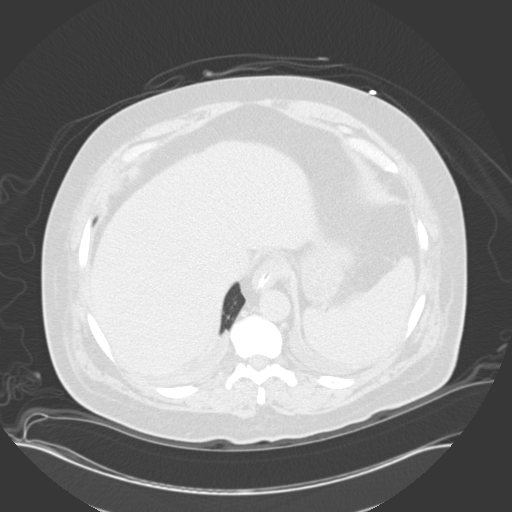
[im 146/167  soft-tissue]
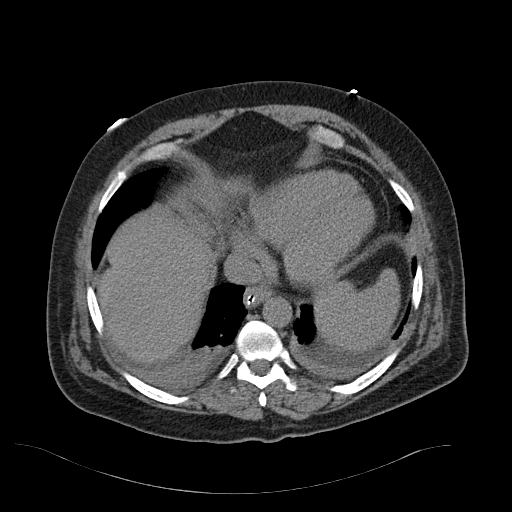
[im 146/167  lung]
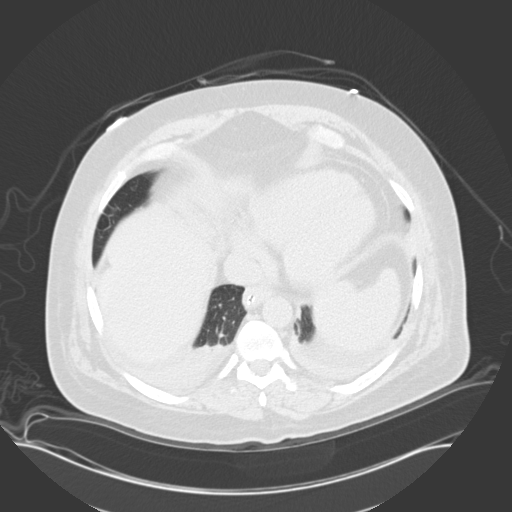
[im 153/167  lung]
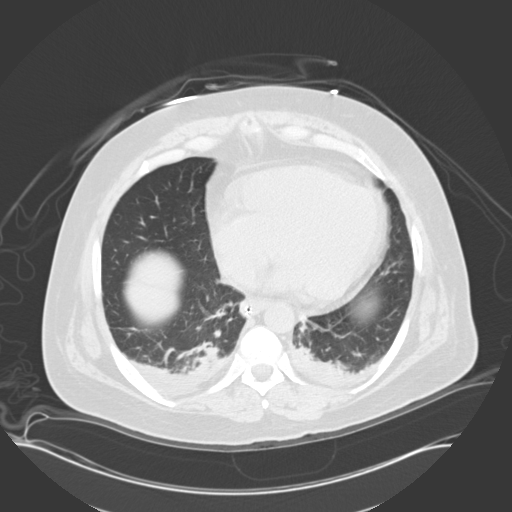
[im 160/167  soft-tissue]
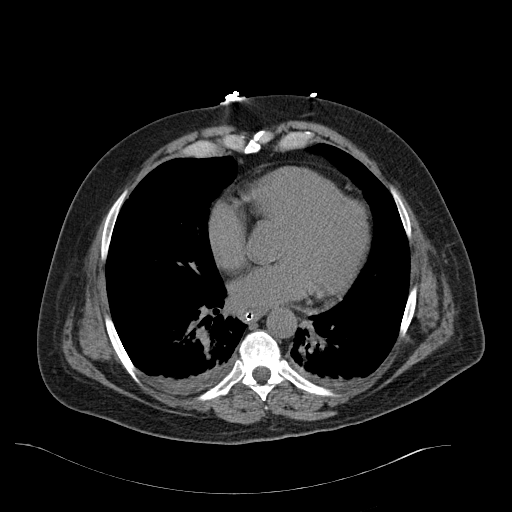
[im 160/167  lung]
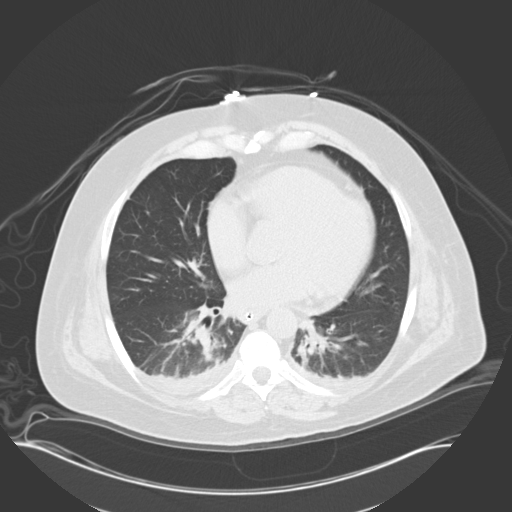

[15 of 32 positions shown; findings below may reference images not displayed]

PROCEDURE:     CT  - CT ABDOMEN AND PELVIS W[DATE] [DATE]

RESULT:     CT of the abdomen and pelvis without contrast is reconstructed
at 3.0 mm slice thickness in the axial plane. There is no previous similar
study for comparison.

The there is peripancreatic inflammatory stranding concerning for mild
pancreatitis especially around the pancreatic tail. Correlate with clinical
and laboratory data. A nasogastric tube is present with the tip in the body
of the stomach. The gallbladder is present without radiopaque stones
demonstrated. The kidneys show no obstruction or stones. There is no
abnormal bowel distention or definite bowel wall thickening. No significant
ascites or abnormal fluid collection is demonstrated. There is a small
amount of urine in the urinary bladder. The stomach is nondistended. The
spleen is unremarkable. Small bilateral pleural effusions and bilateral
lower lobe atelectasis/infiltrate is present. There is no evidence of
interstitial edema. The liver appears unremarkable.
IMPRESSION: 1. Findings suggestive of pancreatitis. Correlate with clinical and
laboratory data.
2. There is some perinephric stranding. Correlate with urinalysis for
urinary tract infection.
3. No evidence of bowel obstruction or perforation. Nasogastric tube present
as described.
4. Trace pleural effusions with bilateral lower lobe atelectasis/infiltrate.

[REDACTED]

## 2014-11-09 NOTE — Consult Note (Signed)
Comments   Met with pt and family (daughter and ex-wife) we again discussed code status. Pt tells family that he does not want reintubation and would prefer DNR. Family agrees to respect pt's wishes. Will change code status to reflect pt's decision.  15 minutes  Electronic Signatures: Borders, Kirt Boys (NP)  (Signed 21-Jun-13 12:18)  Authored: Palliative Care   Last Updated: 21-Jun-13 12:18 by Irean Hong (NP)

## 2014-11-09 NOTE — Consult Note (Signed)
Chief Complaint:   Subjective/Chief Complaint Despite CT finding of possible pancreatitis, lipase and lfts normal.  Continue ppi, antiemetics.  Patietn not a candidate for sedated luminal evaluation.  Following.   Electronic Signatures: Barnetta ChapelSkulskie, Martin (MD)  (Signed 22-Jun-13 18:15)  Authored: Chief Complaint   Last Updated: 22-Jun-13 18:15 by Barnetta ChapelSkulskie, Martin (MD)

## 2014-11-09 NOTE — H&P (Signed)
PATIENT NAME:  Corey Kim, Corey Kim MR#:  [id number] DATE OF BIRTH:  1960-09-22  DATE OF ADMISSION:  01/02/2012  PRIMARY CARE PHYSICIAN:  Dr. Hilda Blades  REFERRING PHYSICIAN:  Dr. Buford Dresser   CHIEF COMPLAINT: Altered mental status, respiratory failure, and unresponsive.   HISTORY OF PRESENT ILLNESS: The patient is a 54 year old male with a known history of endstage chronic obstructive pulmonary disease with ongoing smoking and schizoaffective disorder who is being admitted for acute on chronic respiratory failure likely due to drug overdose. The patient was brought in to the Emergency Department via EMS from home as he was found unresponsive. He was given 2 mg of intramuscular Narcan with not much change in his mental status. In the ED, he was still not responsive or barely responsive to deep painful stimuli and was breathing at 44 per minute. He was found to have acidosis with a pH of 7.17 on blood gas and was immediately intubated while in the Emergency Department. Most of the information is from the records and from the patient's family who was at the bedside when I evaluated the patient. The patient's daughter lives with him and reported the patient was doing well until yesterday but last night he had a spell of breathing difficulty around 3:00 a.m. She woke up and gave him a nebulizer breathing treatment. She also gave him some Xanax to relax and she fell asleep as she was up all night. When she woke up at 3:00 p.m. today the patient was found unresponsive. He was breathing but would not respond. EMS was called and the patient was brought to the Emergency Department.   The patient was discharged to Hospice home last year in July and in August he was sent home and Hospice was following at home usually 2 to 3 times a week. As per daughter and ex-wife who was at the bedside the patient would not allow anybody to check on his medication. He has been taking lots of Goody powders, at least 50- count box or two  boxes in two days. He has also  continued to smoke. He was recently prescribed Xanax 0.25 mg every four hours on 06/11, about 75 tablets, which is all finished and the bottle is empty. He was also on morphine 30 mg which was prescribed on the 29th of May and that bottle is also finished, and there was a concern for possible drug overdose.   PAST MEDICAL HISTORY:  1. Obesity.  2. Sleep apnea.  3. Chronic respiratory failure likely due to chronic obstructive pulmonary disease.  4. Schizoaffective disorder.  5. Chronic obstructive pulmonary disease with ongoing tobacco abuse.  6. Hypertension.   REVIEW OF SYSTEMS: Unable to be obtained as the patient is intubated.   ALLERGIES: Shellfish.   SOCIAL HISTORY: Ongoing tobacco abuse, about a pack and half a day for the last 30 to 40 years. No alcohol use. No illicit drug use. He lives at home, followed by Hospice.  His daughter lives with him.   FAMILY HISTORY: Negative for coronary artery disease and hypertension as per records.   MEDICATIONS AT HOME:  1. Advair 250/50 1 puff b.i.d.  2. Xanax 0.25 mg, 1 to 2 tablets every four hours as needed.  3. Fentanyl 100 mcg/hour transdermal, two patches every three days as needed.  4. Fentanyl 50 mcg transdermal, one patch every 72 hours as needed.  5. Fluphenazine 1 mL injectable every three days.  6. Lasix 20 mg p.o. daily as needed.  7. Morphine 30  mg, 2 tablets p.o. every two hours as needed.  8. Sennalax-S 1 to 2 tablets  2 times a day as needed.  PHYSICAL EXAMINATION:  VITAL SIGNS: Temperature 98, heart rate 91 per minute, respirations 44 per minute, blood pressure earlier was 67/47. Last pressure now was 130/78 mmHg. He is on full assist control ventilator saturating 94%, but early on he was saturating 80% on room air.   GENERAL: The patient is a 54 year old morbidly obese male lying in bed, intubated. He is still moving around, requiring higher dose of sedation.   HEENT: Head atraumatic,  normocephalic. Oropharynx- he has endotracheal tube in place.  Pupils are reactive to light and accommodation. They are dilated. Sclerae anicteric. No conjunctival injection.   NECK: Supple. No jugular venous distention. No thyroid enlargement or tenderness.   LUNGS: Decreased breath sounds at the bases bilaterally. No wheezing, rales, rhonchi, or crepitation.   CARDIOVASCULAR: S1, S2 normal. No murmurs, rubs, or gallop.   ABDOMEN: Soft, obese, nontender, nondistended. Bowel sounds present. No organomegaly or masses. Difficult evaluation due to morbid obesity.   EXTREMITIES: No pedal edema, cyanosis, or clubbing.   NEUROLOGIC: Unable to evaluate fully as the patient is sedated due to intubation and ventilator, but he seemed to be moving his entire body and is requiring a higher dose of sedation.  SKIN: Moist and warm with no rashes.   PSYCHIATRIC: Unable to assess him due to him intubated and on sedation.   LABORATORY DATA: Sodium 135, potassium 7.8, chloride 89, CO2 44, BUN 12, creatinine 1.48, blood glucose 149. Alcohol level less than 3. Normal liver function tests. Normal first set of troponin. Urine drug screen was positive for benzodiazepine and opiates. CBC within normal limits except white count 15.5. Hematocrit 55.3.  Normal coags. Urinalysis showed trace bacteria., 7 WBCs, otherwise negative. Serum salicylate level was elevated with a value of 3.4. Tylenol level less than 2. ABG showed pH of 7.17, pCO2 of 128, pO2 of 69.   CT scan of the head without contrast in the Emergency Department showed no acute abnormality. Chest x-ray showed mild atelectasis at the left lung base. Endotracheal tube in good position. EKG showed normal sinus rhythm with possible left atrial enlargement and T-wave inversion in leads V1 to V4.   IMPRESSION AND PLAN:  1. Acute on chronic respiratory failure likely due to drug overdose with underlying chronic obstructive pulmonary disease: We will start him on IV  steroids. Obtain pulmonary consult and start nebulizer breathing treatments. We will continue Advair. 2. Drug overdose with possible Xanax, morphine, and fentanyl: It seems he was prescribed quite a high dose and he might be using more than was prescribed.  We will monitor closely while in the Critical Care Unit. This is going to be an extremely poor prognosis. If he wakes up we will consider psychiatry consult.  3. Hyperkalemia: He has already been given calcium gluconate along with insulin D50. We will recheck his potassium. It was ordered at 7:35 as a stat lab but the results are not back yet. We will decide the need for Kayexalate based on this lab.  4. Acute renal failure: Likely prerenal. We will hydrate and recheck. Foley catheter has already been placed in the Emergency Department. Doubt ATN although it is possible as he was hypotensive in the ED.  5. Hypotension with a history of hypertension: Likely due to drug overdose. Now his blood pressure has improved. We will continue hydration.  6. Endstage chronic obstructive pulmonary disease with  ongoing smoking, followed by hospice at home: We will ask palliative care to evaluate his CODE STATUS and more clarification as the family seems to have conflicting information. His daughter states FULL CODE and his ex-wife saying DO NOT INTUBATE, possible DO NOT RESUSCITATE. Nonetheless, the patient was already intubated in the Emergency Department as none of them had any documentation or papers.  7. CODE STATUS: FULL CODE.  TOTAL TIME TAKING CARE OF THIS PATIENT (CRITICAL CARE):  55 minutes.    ____________________________ Ellamae Sia. Sherryll Burger, MD vss:bjt D: 01/02/2012 21:38:05 ET T: 01/03/2012 08:31:25 ET JOB#: [id number]  cc: Otelia Hettinger S. Sherryll Burger, MD, <Dictator> Dr. Rowe Pavy [facility] MD ELECTRONICALLY SIGNED 01/03/2012 20:05

## 2014-11-09 NOTE — Consult Note (Signed)
    Comments   I spoke with pt's daughter, Foy GuadalajaraChrista, and her fiance, Alinda Moneyony. These are pt's primary caregivers and daughter is his next of kin (pt is divorced though ex-wife remains very involved in pt's care). Christa and Alinda Moneyony say that pt had destroyed his out-of-facility DNR form and wants to be a full code. However, they also say that pt would never want a trach and would not want to be maintained on vent long-term which is consistent with discussion I had with him during previous hospitalization.   Electronic Signatures: Samari Bittinger, Harriett SineNancy (MD)  (Signed 18-Jun-13 15:00)  Authored: Palliative Care   Last Updated: 18-Jun-13 15:00 by Raeghan Demeter, Harriett SineNancy (MD)

## 2014-11-09 NOTE — Discharge Summary (Signed)
PATIENT NAME:  Corey Kim, Corey Kim MR#:  [id number] DATE OF BIRTH:  1960-10-26  DATE OF ADMISSION:  01/02/2012 DATE OF DISCHARGE:  01/11/2012  ADMITTING PHYSICIAN:  Dr. Delfino Lovett DISCHARGING PHYSICIAN: Dr. Enid Baas    PRIMARY CARE PHYSICIAN:  Dr. Hilda Blades   CONSULTATIONS IN THE HOSPITAL:  1. Pulmonary critical care consultation, Dr. Belia Heman.   2. Palliative care consultation, Dr. Harriett Sine Phifer. 3. GI consultation, Dr. Barnetta Chapel.   DISCHARGE DIAGNOSES:  1. Acute on chronic respiratory failure secondary to chronic obstructive pulmonary disease exacerbation- requiring mechanical ventilation during this hospitalization.  2. Chronic obstructive pulmonary disease exacerbation.  3. Acute pancreatitis.  4. Hypertension.  5. Enterococcus urinary tract infection. 6. Possible sleep apnea.  7. Acute renal failure, resolved.   DISCHARGE MEDICATIONS:  1. Albuterol metered dose inhaler 2 puffs every six hours p.r.n.  2. Advair 250/50 1 puff b.i.d.  3. Prednisone taper.  4. Norvasc 10 mg p.o. daily.  5. Hydralazine 25 mg p.o. t.i.d.  6. Protonix 40 mg p.o. b.i.d.  7. Ciprofloxacin 500 mg p.o. b.i.d. until 01/13/2012.  8. Colace 100 mg p.o. daily.  9. Norco 5/325, 1 tablet p.o. q. 6 hours p.r.n.   DISCHARGE HOME OXYGEN: 1 liter.   DISCHARGE DIET: Low-sodium diet.   DISCHARGE ACTIVITY: As tolerated.     FOLLOWUP INSTRUCTIONS:  1. Primary care physician followup in 1 to 2 weeks.  2. The patient is being discharged home with home Hospice.   LABS AND IMAGING STUDIES: WBC count 14.2, hemoglobin 14.9, hematocrit 46.3, platelet count 139. Sodium 140, potassium 3.7, chloride 105, bicarbonate 26, BUN 18, creatinine 0.6, glucose 106, calcium 8.1. Repeat urinalysis showing no leukocyte esterase, several RBCs, 2+ blood from traumatic Foley catheter, no bacteria seen.  Lipase was within normal limits at 250. ALT 58, AST 41, alkaline phosphatase 67, total bilirubin 0.2, albumin 2.5, serum  magnesium 2.   Urine culture on admission growing more than 100,000 colonies of Enterococcus faecalis which is pansensitive. Echo Doppler showed normal systolic function, ejection fraction 50 to 55%, impaired LV relaxation. Normal right-sided pressures. No effusions seen. Blood cultures remained negative. ABG on admission showed pH of 7.17, pCO2 greater than 128, pO2 69 and sats 90% on 60%  FiO2. His renal function was bad with potassium of 7.8 and sodium of 1.48. Urinalysis on admission had a lot of protein with trace bacteria and few WBCs.  Urine tox screen was positive for opiates and benzos.   BRIEF HOSPITAL COURSE: Mr. Heathman is a 54 year old Caucasian gentleman with past medical history significant for chronic obstructive pulmonary disease, chronic back issues for which he was being followed by home Hospice and is on a lot of narcotics.  He was brought in  unresponsive by the family.  He was found to have a pH of 7.17 and pCO2 greater than 128. The patient is being followed by home Hospice for his endstage chronic obstructive pulmonary disease. He was also on a lot of prescription Xanax and also morphine orally, and there was a concern for possible drug overdose on admission.  1. Acute on chronic respiratory failure secondary to chronic obstructive pulmonary disease exacerbation, possibly worsened by increased intake of narcotics and also Xanax. The patient was intubated and was in the Intensive Care Unit for the initial part of his stay. The patient self-extubated on 01/05/2012 and he did well post extubation. He initially was tried on BiPAP for his chronic obstructive pulmonary disease, especially at nighttime, but could not tolerate it.  He was gradually weaned to 1 liter nasal cannula. He was also on Solu-Medrol which is being tapered to p.o. prednisone, and he is on Advair and albuterol metered dose inhaler. The patient overall strength was very weak globally and physical therapy recommended rehab  for him. However, the patient refused rehab and even his daughter and his ex-wife who take care of him do not want him to go to rehab and want him back home with Hospice following him. His strength had improved by the time of discharge. He was not requiring any assistance, but he was shaky and wobbly on walking.  2. Acute pancreatitis. Post extubation the patient suffered with intractable nausea and vomiting with mild abdominal pain. He had an NG tube with greater than 2 liters of greenish drainage per day. He was seen by GI physician Dr. Marva PandaSkulskie for the same. CT of the abdomen did show mild inflammation of the pancreas, though his lipase was within normal limits. However, he was treated with bowel rest. There was no evidence of obstruction on follow-up abdominal CTs as well. His symptoms gradually improved. He was initially started on Reglan while in the hospital since his symptoms improved and he was able to tolerate soft, low residue diet.  His NG tube was taken out and Reglan was also stopped while in the hospital.  3. Enterococcus urinary tract infection. He was treated with Cipro while in the hospital and he will finish off the course as an outpatient.  4. Acute renal failure with hyperkalemia on admission, resolved with fluids and medications.  5. Back pain issues.  He will be discharged only on Norco at the time of discharge, especially with the recent possible unintentional excessive drug intake with altered mental status situation. He  has not had complaints while in the hospital and did okay with Norco.   6. His course has been otherwise uneventful.  7. CODE STATUS: DO NOT RESUSCITATE as per palliative care discussion with the patient and also the family.   DISCHARGE CONDITION: Stable.   DISCHARGE DISPOSITION: Home with Hospice.   TIME SPENT ON DISCHARGE: 45 minutes.    ____________________________ Enid Baasadhika Jayleen Scaglione, MD rk:bjt D: 01/17/2012 11:35:21 ET T: 01/18/2012 10:21:39  ET JOB#: [id number]  cc: Enid Baasadhika Nevaya Nagele, MD, <Dictator> Dr. Donnamarie PoagPolanco Adley Mazurowski MD ELECTRONICALLY SIGNED 01/23/2012 8:03

## 2014-11-09 NOTE — Consult Note (Signed)
  VITAL SIGNS/ANCILLARY NOTES: **Vital Signs.:   25-Jun-13 13:10   Vital Signs Type Q 4hr   Temperature Temperature (F) 98.1   Celsius 36.7   Temperature Source Oral   Pulse Pulse 95   Pulse source per vital sign device   Respirations Respirations 24   Systolic BP Systolic BP 563   Diastolic BP (mmHg) Diastolic BP (mmHg) 88   Mean BP 110   BP Source vital sign device   Pulse Ox % Pulse Ox % 90   Pulse Ox Activity Level  At rest   Oxygen Delivery Room Air/ 21 %  *Intake and Output.:   25-Jun-13 02:03   Stool  large liquid stool    05:09   Stool  med loose stool    08:37   Stool  med liq dark green    12:45   Stool  small liq brown    13:09   Stool  small liquid stool, blackish green       Lab Results:  Routine Hem:  24-Jun-13 05:57    WBC (CBC)  14.2   RBC (CBC) 4.91   Hemoglobin (CBC) 14.9   Hematocrit (CBC) 46.3   Platelet Count (CBC)  139   MCV 94   MCH 30.4   MCHC 32.2   RDW  14.6   Neutrophil % 87.0   Lymphocyte % 6.2   Monocyte % 6.5   Eosinophil % 0.1   Basophil % 0.2   Neutrophil #  12.4   Lymphocyte #  0.9   Monocyte # 0.9   Eosinophil # 0.0   Basophil # 0.0 (Result(s) reported on 09 Jan 2012 at 06:35AM.)  Routine Chem:  24-Jun-13 05:57    Glucose, Serum  106   BUN 18   Creatinine (comp) 0.68   Sodium, Serum 140   Potassium, Serum 3.7   Chloride, Serum 105   CO2, Serum 26   Calcium (Total), Serum  8.1   Anion Gap 9   Osmolality (calc) 282   eGFR (African American) >60   eGFR (Non-African American) >60 (eGFR values <90mL/min/1.73 m2 may be an indication of chronic kidney disease (CKD). Calculated eGFR is useful in patients with stable renal function. The eGFR calculation will not be reliable in acutely ill patients when serum creatinine is changing rapidly. It is not useful in  patients on dialysis. The eGFR calculation may not be applicable to patients at the low and high extremes of body sizes, pregnant women, and vegetarians.)    Assessment/Plan:     Electronic Signatures: Loistine Simas (MD)  (Signed 25-Jun-13 16:15)  Authored: Chief Complaint, VITAL SIGNS/ANCILLARY NOTES, Brief Assessment, Lab Results, Assessment/Plan   Last Updated: 25-Jun-13 16:15 by Loistine Simas (MD)

## 2014-11-09 NOTE — H&P (Signed)
PATIENT NAME:  Corey Kim, Corey Kim MR#:  [id number] DATE OF BIRTH:  1960-10-30  DATE OF ADMISSION:  01/21/2012  REFERRING PHYSICIAN: Dr. Renae Gloss  FAMILY PHYSICIAN: Dr. Hilda Blades   REASON FOR ADMISSION: Altered mental status with acute on chronic respiratory failure.   HISTORY OF PRESENT ILLNESS: The patient is a 54 year old male discharged approximately 10 days ago after being admitted with acute on chronic respiratory failure due to chronic obstructive pulmonary requiring mechanical ventilation. The patient has a significant history of obesity, sleep apnea, and chronic respiratory failure on oxygen with chronic obstructive pulmonary disease. He also has schizoaffective disorder. He has chronic pain and has been on narcotics and benzodiazepines at home. He was hospitalized recently at which time he required intubation. He was extubated and made a NO CODE during that hospitalization. He was discharged home with Hospice. He represents now to the Emergency Room with altered mental status and lethargy having worsening tachypnea and increased work of breathing. In the Emergency Room, the patient was found to be tachypneic with hypercarbia. He was extremely sedated. He had taken a large amount of hydrocodone and Xanax earlier in the day. He is in the Emergency Room on BiPAP and is now admitted for further evaluation.   PAST MEDICAL HISTORY:  1. Chronic respiratory failure, on oxygen.  2. Chronic obstructive pulmonary disease.  3. History of respiratory failure requiring mechanical ventilation.  4. Sleep apnea.  5. Obesity.  6. History of pancreatitis.  7. Benign hypertension.  8. History of acute renal failure, resolved.  9. Schizoaffective disorder.   MEDICATIONS: 1. Albuterol 2 puffs q. six hours p.r.n.  2. Advair 250/50 1 puff b.i.d.  3. Prednisone taper as directed.  4. Norvasc 10 mg p.o. daily.  5. Hydralazine 25 mg p.o. t.i.d.  6. Protonix 40 mg p.o. b.i.d.  7. Colace 100 mg p.o. daily.   8. Norco 5/325 mg 1 p.o. q. 6 hours p.r.n. pain.  9. Xanax 0.25 mg 1 to 2 p.o. every four hours p.r.n. anxiety.  10. Morphine 30 mg, 2 p.o. q. two hours as needed for pain.   ALLERGIES: Shellfish.   SOCIAL HISTORY: The patient has a long-standing history of tobacco abuse of greater than 50 pack-years. No history of alcohol abuse. Lives at home and is followed by Hospice.   FAMILY HISTORY: Negative for coronary artery disease or hypertension.   REVIEW OF SYSTEMS: Unable to obtain from the patient.   PHYSICAL EXAMINATION:  GENERAL: The patient is lethargic, confused, in severe respiratory distress, currently on BiPAP.   VITAL SIGNS: Vital signs remarkable for a blood pressure of 149/86, with a heart rate of 101 and a respiratory rate of 32.   HEENT: Normocephalic, atraumatic. Pupils equally round and reactive to light and accommodation. Extraocular movements are intact. Sclerae are anicteric. Conjunctivae are clear.  Oropharynx is dry but clear.   NECK: Supple without jugular venous distention. No adenopathy or thyromegaly is noted.   LUNGS: Decreased breath sounds. No wheezes or rhonchi noted. Cardiac exam revealed a rapid rate with a regular rhythm. Normal S1 and S2. No significant murmurs.   ABDOMEN: Soft, nontender, with normoactive bowel sounds. No organomegaly or masses were appreciated. No hernias or bruits were noted.   EXTREMITIES: Without edema. Pulses are 1+ bilaterally.   SKIN: Warm and dry without rash or lesions.   NEUROLOGIC: Unable to be evaluated fully because of the patient's lethargy. He is moving all extremities spontaneously.   LABORATORY, DIAGNOSTIC, AND RADIOLOGICAL DATA: ABG on 4  liters revealed a pCO2 of 112 with a pO2 of 49 with a pH of 7.23 and a saturation of 84.7%. CBC revealed a white count of 19.6 with a hemoglobin of 10.6. D-dimer was less than 0.22. Sugar was 159 with a BUN of 10 and a creatinine of 0.57 with a sodium of 133. Troponin was 0.02. EKG  revealed sinus tachycardia with no acute ischemic changes. Chest x-ray revealed no obvious pneumonia or heart failure.   ASSESSMENT:  1. Acute on chronic respiratory failure.  2. Hypercarbia with encephalopathy.  3. Chronic obstructive pulmonary disease exacerbation.  4. Sleep apnea.  5. Obesity.  6. Schizoaffective disorder.  7. Narcotics abuse.  8. Chronic anxiety.  9. Chronic back pain.   PLAN: The patient will be given Narcan times one to see if this improves his mental status. He will be maintained on BiPAP. We will follow up ABG in 30 minutes. He will be admitted to the Intensive Care Unit as a FULL CODE according to the family's wishes. We will consult pulmonology and palliative care. We will intubate if needed according to his respiratory status throughout the night. We will begin IV steroids and IV antibiotics. We will begin Xopenex and Atrovent SVNs. Continue Advair. N.p.o. until he is fully alert. We will follow serial cardiac enzymes. Follow up chest x-ray and labs in the morning. Prognosis is poor. This was discussed with the family. Further treatment and evaluation will depend upon the patient's progress.   TOTAL TIME SPENT ON ADMISSION: 55 minutes.  ____________________________ Duane LopeJeffrey D. Judithann SheenSparks, MD jds:bjt D:  01/21/2012 02:55:31 ET         T: 01/21/2012 11:51:59 ET        JOB#: [id number]  cc: Dr. Fleet ContrasPolanco   Ursala Cressy D Babacar Haycraft MD ELECTRONICALLY SIGNED 01/22/2012 21:29

## 2014-11-09 NOTE — Consult Note (Signed)
Chief Complaint:   Subjective/Chief Complaint case discussed withDr Nemiah CommanderKalisetti.  agre with current, tolerating liquids, ngt out. minimal abdominal discomfort.  Plans to d/c tomorrow as possible noted. Will sign off, reconsult if needed.   VITAL SIGNS/ANCILLARY NOTES: **Vital Signs.:   25-Jun-13 13:10   Vital Signs Type Q 4hr   Temperature Temperature (F) 98.1   Celsius 36.7   Temperature Source Oral   Pulse Pulse 95   Pulse source per vital sign device   Respirations Respirations 24   Systolic BP Systolic BP 154   Diastolic BP (mmHg) Diastolic BP (mmHg) 88   Mean BP 110   BP Source vital sign device   Pulse Ox % Pulse Ox % 90   Pulse Ox Activity Level  At rest   Oxygen Delivery Room Air/ 21 %   Electronic Signatures: Barnetta ChapelSkulskie, Taimi Towe (MD)  (Signed 25-Jun-13 16:01)  Authored: Chief Complaint, VITAL SIGNS/ANCILLARY NOTES   Last Updated: 25-Jun-13 16:01 by Barnetta ChapelSkulskie, Jameon Deller (MD)

## 2014-11-09 NOTE — Discharge Summary (Signed)
PATIENT NAME:  Corey Kim, Corey Kim MR#:  [id number] DATE OF BIRTH:  1961-02-22  DATE OF ADMISSION:  01/21/2012 DATE OF DISCHARGE:  01/23/2012  PRIMARY CARE PHYSICIAN: Nonlocal.  CONSULTANTS:  1. Pulmonary, Dr. Meredeth IdeFleming. 2. Palliative Care, Dr. Harvie JuniorPhifer.   DISCHARGE DIAGNOSES:  1. Acute on chronic respiratory failure.  2. Hypercarbia with encephalopathy.  3. Chronic obstructive pulmonary disease exacerbation.  4. Anemia.  5. Hypertension.  6. Obstructive sleep apnea.  7. Narcotic abuse.   CONDITION: Stable.   MEDICATIONS:  1. Alprazolam 0.5 mg p.o. 1 to 2 tablets every 4 hours p.r.n.  2. Sennalax-S 50 mg/8.6 mg p.o. 1 to 2 tablets b.i.d. p.r.n.  3. Albuterol 2 puffs every 6 hours p.r.n.  4. Hydralazine 25 mg p.o. t.i.d.  5. Pantoprazole 40 mg p.o. b.i.d.  6. Norvasc 10 mg p.o. daily.  7. Colace 100 mg p.o. daily.  8. Norco 5 mg/325 mg p.o. every 4 hours p.r.n.   ADDITIONAL MEDICATIONS:  1. Prednisone 40 mg p.o. daily, then taper.  2. Continue home oxygenation at 2 liters by nasal cannula.   DIET: Low sodium diet.  ACTIVITY: As tolerated.   FOLLOW-UP CARE:  1. Follow up with PCP within  1 to 2 weeks. In addition, the patient needs to follow-up with her primary care physician for anemia work-up due to decreased hemoglobin  8.3. The patient may need GI referral for colonoscopy.  2. Follow up with Dr. Meredeth IdeFleming within 1 to 2 weeks.   REASON FOR ADMISSION: Altered mental status with acute on chronic respiratory failure.   HOSPITAL COURSE: The patient is a 54 year old male with a history of chronic respiratory failure on home oxygen, chronic obstructive pulmonary disease, obstructive sleep apnea,  obesity. The patient has recurrent  acute on chronic respiratory failure and was hospitalized, intubated, extubated in previous hospitalization. The patient was made a no code during previous hospitalization and was discharged with Hospice. He came to the ED with altered mental status,  lethargy, tachypnea and increased work of breathing. In the ED, he was found tachypneic with hypercarbia. He was extremely sedated. He had taken a large amount of hydrocodone and Xanax earlier that day. He was treated with BiPAP and admitted for further evaluation. For detailed history and physical examination, please refer to the admission note dictated by Dr. Judithann SheenSparks. In the Emergency Department, the patient was placed on BiPAP with nebulizer, IV Solu-Medrol, Xopenex, Advair and chest x-ray revealed no obvious  pneumonia or heart failure. ABG on 4 liters revealed a PCO2 112, pO2 49, with pH 7.23. Oxygen saturation is 84.7%. CBC showed white count 19.6, hemoglobin 10.6. After the above-mentioned treatment, the patient became alert, awake, and was taken off BiPAP the next day after admission. The patient denies any cough, sputum, or shortness of breath, but his hemoglobin decreased to 8.3. I discussed about the patient's situation and the decreased hemoglobin. The patient wanted to go home and follow-up with primary care physician for further anemia work-up. He denies any bloody stool, melena, or any active bleeding. According to Dr. Ernestene KielPhifer's note, the patient wants FULL CODE and wants the trach/PEG/LTACH if needed. He may not be appropriate for Hospice service.   DISCHARGE VITAL SIGNS:  The patient's vital signs today: Temperature 98.4, blood pressure 130/74, pulse 107, respirations 18, oxygen saturation 93% on room air, and 2 liters by nasal cannula.   The patient is clinically stable and will be discharged to home today. I discussed the patient's discharge plan and advised the patient  to follow-up with his primary care physician for further anemia work-up and he agrees.    TIME SPENT: About 40 minutes   ____________________________ Shaune Pollack, MD qc:cbb D: 01/23/2012 14:51:26 ET T: 01/24/2012 11:15:40 ET JOB#: [id number]  cc: Shaune Pollack, MD, <Dictator> Shaune Pollack MD ELECTRONICALLY SIGNED 01/24/2012  15:54

## 2014-11-09 NOTE — Consult Note (Signed)
Chief Complaint:   Subjective/Chief Complaint currently denies abdominal pain or nausea.  Still has ngt in place with some dark green output.  more alert, still with some difficulty in expression.   VITAL SIGNS/ANCILLARY NOTES: **Vital Signs.:   23-Jun-13 12:55   Vital Signs Type Q 4hr   Temperature Temperature (F) 99.5   Celsius 37.5   Temperature Source Oral   Pulse Pulse 84   Pulse source per vital sign device   Respirations Respirations 33   Systolic BP Systolic BP 696   Diastolic BP (mmHg) Diastolic BP (mmHg) 789   Mean BP 119   BP Source vital sign device   Pulse Ox % Pulse Ox % 93   Pulse Ox Activity Level  At rest   Oxygen Delivery 5L; Nasal Cannula   Brief Assessment:   Cardiac Regular    Respiratory rhonchi  mild    Gastrointestinal details normal Nontender  Bowel sounds normal  obese, unable to palpate internal organs   Lab Results: Routine Chem:  23-Jun-13 04:00    Glucose, Serum  116   BUN  26   Creatinine (comp) 0.80   Sodium, Serum 145   Potassium, Serum  3.2   Chloride, Serum 106   CO2, Serum  36   Calcium (Total), Serum  8.1   Anion Gap  3   Osmolality (calc) 294   eGFR (African American) >60   eGFR (Non-African American) >60 (eGFR values <56m/min/1.73 m2 may be an indication of chronic kidney disease (CKD). Calculated eGFR is useful in patients with stable renal function. The eGFR calculation will not be reliable in acutely ill patients when serum creatinine is changing rapidly. It is not useful in  patients on dialysis. The eGFR calculation may not be applicable to patients at the low and high extremes of body sizes, pregnant women, and vegetarians.)  Routine UA:  23-Jun-13 11:02    Color (UA) Yellow   Clarity (UA) Cloudy   Glucose (UA) Negative   Bilirubin (UA) Negative   Ketones (UA) Trace   Specific Gravity (UA) 1.017   Blood (UA) 2+   pH (UA) 9.0   Protein (UA) 30 mg/dL   Nitrite (UA) Negative   Leukocyte Esterase (UA) Negative  (Result(s) reported on 08 Jan 2012 at 01:29PM.)   RBC (UA) 298 /HPF   WBC (UA) 4 /HPF   Bacteria (UA) NONE SEEN   Epithelial Cells (UA) NONE SEEN   Amorphous Crystal (UA) PRESENT (Result(s) reported on 08 Jan 2012 at 01:29PM.)   Assessment/Plan:  Assessment/Plan:   Assessment 1) nausea, epigastric and luq pain-resolved.  abnormal ct result noted.  2) s/p intubation/extubation and treatment for respiratory failure in the setting of severe copd and continues tobacco use.    Plan 1) will change ng suction to alternating q 2 hours, if low output will cap until am, probable d/c in am.  continue ppi and reglan.  abd 2 way film in am, will continue current bowel regimen for constipation,   following.   Electronic Signatures: SLoistine Simas(MD)  (Signed 23-Jun-13 16:09)  Authored: Chief Complaint, VITAL SIGNS/ANCILLARY NOTES, Brief Assessment, Lab Results, Assessment/Plan   Last Updated: 23-Jun-13 16:09 by SLoistine Simas(MD)

## 2014-11-09 NOTE — Consult Note (Signed)
PATIENT NAME:  Corey Kim, Corey J MR#:  [id number] DATE OF BIRTH:  [birth year]  DATE OF CONSULTATION:  01/07/2012  REFERRING PHYSICIAN:  Dr. Nemiah CommanderKalisetti  CONSULTING PHYSICIAN:  Christena DeemMartin U. Cherylin Waguespack, MD  REASON FOR CONSULTATION: Nausea, vomiting.   HISTORY OF PRESENT ILLNESS: Corey Kim is a 54 year old Caucasian male who was admitted 01/02/2012 with a drug overdose. This apparently was an unintentional drug overdose. He has a history of severe chronic obstructive pulmonary disease. He required urgent intubation on admission and over the course of the hospitalization has been weaned. He is currently on 6 liters oxygen by nasal cannula. He is a fairly difficult historian. He states that his nausea and vomiting began just a day or two ago. He has had some generalized abdominal discomfort/pain. He states that prior to admission he had no problems with nausea or abdominal pain but at other times will say that yes he did have some nausea. He is a long-term chronic Goody powder abuser taking multiple daily for many, many years. He did have apparently some dyspepsia prior to coming to the hospital. He denied any problems with heartburn otherwise or dysphagia. He generally had a bowel movement once a day. It is of note that he has had no bowel movement since his hospitalization recorded. He denies any history of peptic ulcer disease. He states he has had a previous colonoscopy and an [place] years ago [place], cannot remember the results, cannot remember the physician's name. He stopped drinking alcohol a couple of years ago but continues to smoke.   GI FAMILY HISTORY: Apparently negative for colorectal cancer, liver disease or ulcers.   PAST MEDICAL HISTORY:  1. Chronic recurrent respiratory failure requiring multiple hospitalizations secondary to chronic obstructive pulmonary disease.  2. History of schizoaffective disorder.  3. Ongoing tobacco abuse.  4. Hypertension.  5. Sleep apnea.   6. Obesity.  REVIEW OF SYSTEMS: Patient is unable to contribute review of systems, poor historian, unable at times to correlate two facts or two points of history.    ALLERGIES: He is allergic apparently to shellfish.   SOCIAL HISTORY: Continuing tobacco use. No current alcohol use. No illicit drug use. He had lived at home and had been followed by hospice care.   OUTPATIENT MEDICATIONS:  1. Advair Diskus 250/50 mcg twice a day. 2. Alprazolam 0.25 mg every four hours p.r.n.  3. Fentanyl 100 mcg patch two patches actually every three days.  4. Fentanyl 50 mcg per hour patch one every 72 hours as needed.  5. Fluphenazine decanoate 25 mg/mL injectable solution. 6. Lasix 20 mg p.o. as needed. 7. Morphine 30 mg 2 tablets every two hours p.r.n.  8. Senna S 1 to 2 tabs b.i.d.   PHYSICAL EXAMINATION:  VITAL SIGNS: Temperature 99.1, pulse 82, respirations 26 through 42, blood pressure 169/83, pulse oximetry 86 through 92 currently on 6 liters nasal cannula.   GENERAL: He is an obese 54 year old Caucasian male, some obvious discomfort with some mild shortness of breath.   HEENT: Normocephalic, atraumatic. Eyes are anicteric. Nose: Septum midline. Oropharynx no lesions.   NECK: No JVD.   HEART: Regular rate and rhythm.   LUNGS: Clear to occasional rhonchi.   ABDOMEN: Protuberant, soft. He is markedly tender throughout the epigastrium leading across into the left upper quadrant and somewhat down the left side. Less so tender in the right lower quadrant. He has no masses or rebound. It is difficult to palpate internal organs due to body habitus. Bowel sounds are  positive but distant.   EXTREMITIES: No clubbing or cyanosis.   NEUROLOGICAL: Cranial nerves II through XII grossly intact. Muscle strength bilaterally equal and symmetric.   LABORATORY, DIAGNOSTIC, AND RADIOLOGICAL DATA: From this morning shows glucose 137, BUN 26, creatinine 0.81, sodium 147, potassium 3.8, chloride 104,  bicarbonate 36, calcium 8.3. Hemogram showing a white count of 10.3, hemoglobin and hematocrit 16.2/49.3, platelet count 138, MCV 95. He had a flat and upright abdominal film yesterday showing no acute changes. There is no mention of stool or problems with constipation. He had a CT scan done a couple of hours ago of the abdomen and pelvis showing findings suggestive of pancreatitis with some peripancreatic inflammatory stranding around the pancreatic tail. This would correlate with his physical examination where he is quite tender in the left upper quadrant extending laterally. There is no evidence of bowel obstruction or perforation. NG tube as noted. Some perinephric stranding. Trace pleural effusions with bilateral lower lobe atelectasis/infiltrate.   ASSESSMENT: Apparently new nausea, vomiting developing since his hospitalization in the setting of known previous long-term history of aspirin abuse. Patient is currently on a proton pump inhibitor twice a day as well as metoclopramide. CT finding as noted with possible interval development of pancreatitis. Review of medications show no medications with strong predilection towards pancreatitis as a side effect. He has, however, been on azithromycin. He has also been on IV steroids every 12 hours. This could be a causative agent for the pancreatitis. It is also of note he has had no bowel movement recorded since his hospitalization. There is an NG tube in place on suction. There is a small amount of red blood in the NG tube mixed with frothy white saliva-type material. This is likely from NG tube trauma/suction trauma.   RECOMMENDATIONS: I have ordered a STAT lipase for further evaluation. It would be clinically difficult to discontinue his steroids currently due to his very poor respiratory status. This may, however, need to be considered should his pancreatitis worsen. He does have a history of long-term aspirin abuse and continue on a b.i.d. IV PPI is  appropriate. He is currently on a clear liquid diet. Will suspend this until his labs return. Will also obtain LFTs. May need abdominal ultrasound in regards to acalculous cholecystitis/calculus cholecystitis and possibly microlithiasis as an etiology for pancreatitis. Will follow with you.  ____________________________ Christena Deem, MD mus:cms D: 01/07/2012 13:10:18 ET T: 01/07/2012 13:20:29 ET  JOB#: [id number] cc: Christena Deem, MD, <Dictator> Christena Deem MD ELECTRONICALLY SIGNED 01/11/2012 12:09

## 2014-11-09 NOTE — Consult Note (Signed)
Brief Consult Note: Diagnosis: nausea vomiting abdominal pain.   Patient was seen by consultant.   Consult note dictated.   Recommend to proceed with surgery or procedure.   Recommend further assessment or treatment.   Orders entered.   Comments: Please see full GI consult.  Patietn recently weaned from vent after hospitalization on 6.17 for respiratory failure in the setting of h/o severe copd.  Interval development of mostly upper left/ generalized abdominal pain, ct done earlier today showing possible pancreatitis. Meds reviewed, on steroids as a possible etiology.  Outpatient h/o asa abuse. no h/o pudz.  Lipase and lfts ordered, ddx incluides biliary pancreatitis with calculus/acalculus cholecystitis.  May also need to start miralax daily.  Following..  Electronic Signatures: Barnetta ChapelSkulskie, Fredi Hurtado (MD)  (Signed [phone]-Jun-13 13:18)  Authored: Brief Consult Note   Last Updated: 22-Jun-13 13:18 by Barnetta ChapelSkulskie, Ki Corbo (MD)

## 2014-11-09 NOTE — Consult Note (Signed)
Chief Complaint:   Subjective/Chief Complaint feeling a little better, denies n/v or abdominal pain, had a large bm.   VITAL SIGNS/ANCILLARY NOTES: **Vital Signs.:   24-Jun-13 13:08   Vital Signs Type Routine   Temperature Temperature (F) 98.5   Celsius 36.9   Temperature Source Oral   Pulse Pulse 85   Pulse source per vital sign device   Respirations Respirations 20   Systolic BP Systolic BP 381   Diastolic BP (mmHg) Diastolic BP (mmHg) 91   Mean BP 108   BP Source vital sign device   Pulse Ox % Pulse Ox % 93   Pulse Ox Activity Level  At rest   Oxygen Delivery 5L  *Intake and Output.:   24-Jun-13 05:03   NG Suction ml     Out:  350    Shift 07:00   NG Suction ml     Out:  350    Daily 07:00   NG Suction ml     Out:  575    10:07   Stool  moderate loose/liquid stool, blackish brown    13:00   Stool  moderate dark liquid/soft stool   Brief Assessment:   Cardiac Regular    Respiratory wheezing  few diffuse bilat    Gastrointestinal details normal Nontender  obese, protuberant, positive bowel sounds   Lab Results: Routine Chem:  24-Jun-13 05:57    Glucose, Serum  106   BUN 18   Creatinine (comp) 0.68   Sodium, Serum 140   Potassium, Serum 3.7   Chloride, Serum 105   CO2, Serum 26   Calcium (Total), Serum  8.1   Anion Gap 9   Osmolality (calc) 282   eGFR (African American) >60   eGFR (Non-African American) >60 (eGFR values <90m/min/1.73 m2 may be an indication of chronic kidney disease (CKD). Calculated eGFR is useful in patients with stable renal function. The eGFR calculation will not be reliable in acutely ill patients when serum creatinine is changing rapidly. It is not useful in  patients on dialysis. The eGFR calculation may not be applicable to patients at the low and high extremes of body sizes, pregnant women, and vegetarians.)  Routine Hem:  24-Jun-13 05:57    WBC (CBC)  14.2   RBC (CBC) 4.91   Hemoglobin (CBC) 14.9   Hematocrit (CBC)  46.3   Platelet Count (CBC)  139   MCV 94   MCH 30.4   MCHC 32.2   RDW  14.6   Neutrophil % 87.0   Lymphocyte % 6.2   Monocyte % 6.5   Eosinophil % 0.1   Basophil % 0.2   Neutrophil #  12.4   Lymphocyte #  0.9   Monocyte # 0.9   Eosinophil # 0.0   Basophil # 0.0 (Result(s) reported on 09 Jan 2012 at 06:35AM.)   Assessment/Plan:  Assessment/Plan:   Assessment 1) n/v/abdominal pain-resolved.  NGT still in place, started on clears.abd film without ileus.  2) severe copd--s/p intubation for respiratory failure.    Plan 1) if patient tolerates clears for 24 hours, will remove ngt.  continues clears for a day or two before advancing.  following.   Electronic Signatures: SLoistine Simas(MD)  (Signed 24-Jun-13 15:28)  Authored: Chief Complaint, VITAL SIGNS/ANCILLARY NOTES, Brief Assessment, Lab Results, Assessment/Plan   Last Updated: 24-Jun-13 15:28 by SLoistine Simas(MD)

## 2014-11-09 NOTE — Consult Note (Signed)
Brief Consult Note: Diagnosis: Schizoaffective Disorder, Bipolar Type.   Patient was seen by consultant.   Recommend further assessment or treatment.   Discussed with Attending MD.   Comments: Mr. Julien GirtMcDaniel is a 54 y/o Divorced Caucasian male with Schizoaffective Disorder and multiple medical problems including COPD who was brought to the hospital after being found unresponsive. It was suspected that he overdosed and it may have possibly been a suicide attempt. The patient was offended that I addressed a possible suicide attempt and asked me to leave the room. He as adamantly denying any suicidal intent and says he cannot remember overdosing. He told me to "take my assessment elsewhere".   If the patient is willing to complete an assessment, please feel free to page me. It is at least important to find out which psychotropic medications he was on as an outpatient so that those medications can be restarted.  Electronic Signatures: Caryn SectionKapur, Kazden Largo (MD)  (Signed 21-Jun-13 12:12)  Authored: Brief Consult Note   Last Updated: 21-Jun-13 12:12 by Caryn SectionKapur, Zackariah Vanderpol (MD)
